# Patient Record
Sex: Male | Born: 1950 | Race: Asian | Hispanic: No | Marital: Married
Health system: Southern US, Community
[De-identification: ages and names within clinical notes are randomized; demographics above are authoritative.]

## PROBLEM LIST (undated history)

## (undated) DIAGNOSIS — I255 Ischemic cardiomyopathy: Secondary | ICD-10-CM

## (undated) DIAGNOSIS — I2510 Atherosclerotic heart disease of native coronary artery without angina pectoris: Secondary | ICD-10-CM

## (undated) DIAGNOSIS — I10 Essential (primary) hypertension: Secondary | ICD-10-CM

## (undated) DIAGNOSIS — I219 Acute myocardial infarction, unspecified: Secondary | ICD-10-CM

## (undated) DIAGNOSIS — Z9862 Peripheral vascular angioplasty status: Secondary | ICD-10-CM

## (undated) DIAGNOSIS — E7800 Pure hypercholesterolemia, unspecified: Secondary | ICD-10-CM

## (undated) HISTORY — PX: PROSTATECTOMY: SHX69

## (undated) HISTORY — PX: COLON SURGERY: SHX602

## (undated) HISTORY — PX: ANGIOPLASTY: SHX39

---

## 2013-02-25 ENCOUNTER — Inpatient Hospital Stay: Payer: Self-pay | Admitting: Internal Medicine

## 2013-02-25 LAB — CBC
HGB: 14 g/dL (ref 13.0–18.0)
MCH: 22.7 pg — ABNORMAL LOW (ref 26.0–34.0)
MCV: 72 fL — ABNORMAL LOW (ref 80–100)
RBC: 6.15 10*6/uL — ABNORMAL HIGH (ref 4.40–5.90)
RDW: 15.9 % — ABNORMAL HIGH (ref 11.5–14.5)

## 2013-02-25 LAB — COMPREHENSIVE METABOLIC PANEL
Albumin: 4 g/dL (ref 3.4–5.0)
Alkaline Phosphatase: 80 U/L (ref 50–136)
Anion Gap: 6 — ABNORMAL LOW (ref 7–16)
BUN: 17 mg/dL (ref 7–18)
Bilirubin,Total: 0.3 mg/dL (ref 0.2–1.0)
Glucose: 126 mg/dL — ABNORMAL HIGH (ref 65–99)
Osmolality: 282 (ref 275–301)
Potassium: 4 mmol/L (ref 3.5–5.1)
SGOT(AST): 19 U/L (ref 15–37)
SGPT (ALT): 20 U/L (ref 12–78)
Sodium: 140 mmol/L (ref 136–145)
Total Protein: 7.4 g/dL (ref 6.4–8.2)

## 2013-02-25 LAB — HEMOGLOBIN A1C: Hemoglobin A1C: 6.1 % (ref 4.2–6.3)

## 2013-02-25 LAB — PROTIME-INR: Prothrombin Time: 12.9 secs (ref 11.5–14.7)

## 2013-02-25 LAB — HEMATOCRIT: HCT: 41.9 % (ref 40.0–52.0)

## 2013-02-26 LAB — CBC WITH DIFFERENTIAL/PLATELET
Basophil #: 0 10*3/uL (ref 0.0–0.1)
Eosinophil #: 0.1 10*3/uL (ref 0.0–0.7)
Eosinophil %: 1.9 %
HCT: 38.6 % — ABNORMAL LOW (ref 40.0–52.0)
HGB: 12.4 g/dL — ABNORMAL LOW (ref 13.0–18.0)
Lymphocyte #: 1.5 10*3/uL (ref 1.0–3.6)
Lymphocyte %: 22 %
MCH: 22.8 pg — ABNORMAL LOW (ref 26.0–34.0)
MCHC: 32.1 g/dL (ref 32.0–36.0)
MCV: 71 fL — ABNORMAL LOW (ref 80–100)
Monocyte %: 8.4 %
Neutrophil #: 4.6 10*3/uL (ref 1.4–6.5)
Platelet: 156 10*3/uL (ref 150–440)
WBC: 6.8 10*3/uL (ref 3.8–10.6)

## 2013-02-26 LAB — BASIC METABOLIC PANEL
BUN: 10 mg/dL (ref 7–18)
Calcium, Total: 8.1 mg/dL — ABNORMAL LOW (ref 8.5–10.1)
Creatinine: 1.12 mg/dL (ref 0.60–1.30)
EGFR (African American): >60
Glucose: 90 mg/dL (ref 65–99)
Sodium: 141 mmol/L (ref 136–145)

## 2013-04-07 ENCOUNTER — Ambulatory Visit: Payer: Self-pay | Admitting: Gastroenterology

## 2013-04-10 LAB — PATHOLOGY REPORT

## 2014-09-29 ENCOUNTER — Emergency Department: Payer: Self-pay | Admitting: Emergency Medicine

## 2014-09-29 LAB — URINALYSIS, COMPLETE
Bacteria: NONE SEEN
Bilirubin,UR: NEGATIVE
GLUCOSE, UR: NEGATIVE mg/dL (ref 0–75)
LEUKOCYTE ESTERASE: NEGATIVE
Nitrite: NEGATIVE
PH: 6 (ref 4.5–8.0)
PROTEIN: NEGATIVE
RBC,UR: 1 /HPF (ref 0–5)
SPECIFIC GRAVITY: 1.01 (ref 1.003–1.030)
Squamous Epithelial: NONE SEEN

## 2014-09-29 LAB — COMPREHENSIVE METABOLIC PANEL
ALBUMIN: 4.6 g/dL
ALT: 15 U/L — AB
AST: 21 U/L
Alkaline Phosphatase: 52 U/L
Anion Gap: 8 (ref 7–16)
BUN: 12 mg/dL
Bilirubin,Total: 1.1 mg/dL
Calcium, Total: 9.1 mg/dL
Chloride: 106 mmol/L
Co2: 26 mmol/L
Creatinine: 1.34 mg/dL — ABNORMAL HIGH
EGFR (African American): >60
EGFR (Non-African Amer.): 56 — ABNORMAL LOW
GLUCOSE: 105 mg/dL — AB
Potassium: 4 mmol/L
Sodium: 140 mmol/L
TOTAL PROTEIN: 8.1 g/dL

## 2014-09-29 LAB — CBC WITH DIFFERENTIAL/PLATELET
Basophil #: 0 10*3/uL (ref 0.0–0.1)
Basophil %: 0.1 %
EOS ABS: 0.1 10*3/uL (ref 0.0–0.7)
Eosinophil %: 0.5 %
HCT: 47.4 % (ref 40.0–52.0)
HGB: 14.7 g/dL (ref 13.0–18.0)
LYMPHS PCT: 14 %
Lymphocyte #: 1.4 10*3/uL (ref 1.0–3.6)
MCH: 22.2 pg — ABNORMAL LOW (ref 26.0–34.0)
MCHC: 31.1 g/dL — ABNORMAL LOW (ref 32.0–36.0)
MCV: 72 fL — ABNORMAL LOW (ref 80–100)
MONOS PCT: 4.4 %
Monocyte #: 0.5 x10 3/mm (ref 0.2–1.0)
NEUTROS ABS: 8.3 10*3/uL — AB (ref 1.4–6.5)
NEUTROS PCT: 81 %
PLATELETS: 164 10*3/uL (ref 150–440)
RBC: 6.62 10*6/uL — AB (ref 4.40–5.90)
RDW: 15.9 % — AB (ref 11.5–14.5)
WBC: 10.3 10*3/uL (ref 3.8–10.6)

## 2014-09-29 LAB — MAGNESIUM: Magnesium: 2.1 mg/dL

## 2014-09-29 LAB — LACTIC ACID, PLASMA
LACTIC ACID, VENOUS: 2.2 mmol/L — AB
LACTIC ACID, VENOUS: 2 mmol/L

## 2014-09-29 LAB — PROTIME-INR
INR: 1.1
PROTHROMBIN TIME: 14.1 s

## 2014-09-29 LAB — TROPONIN I

## 2014-09-29 LAB — PHOSPHORUS: Phosphorus: 2.4 mg/dL — ABNORMAL LOW

## 2014-09-29 LAB — RAPID INFLUENZA A&B ANTIGENS

## 2014-09-30 LAB — URINE CULTURE

## 2014-10-04 LAB — CULTURE, BLOOD (SINGLE)

## 2014-10-26 NOTE — Consult Note (Signed)
Chief Complaint:  Subjective/Chief Complaint seen for rectal bleeding.  NO recurrance since yesterday.  denies n/v or abdominal pain.   VITAL SIGNS/ANCILLARY NOTES: **Vital Signs.:   24-Aug-14 13:29  Vital Signs Type Routine  Temperature Temperature (F) 97.4  Celsius 36.3  Temperature Source oral  Pulse Pulse 74  Respirations Respirations 20  Systolic BP Systolic BP 921  Diastolic BP (mmHg) Diastolic BP (mmHg) 80  Mean BP 104  Pulse Ox % Pulse Ox % 99  Pulse Ox Activity Level  At rest  Oxygen Delivery Room Air/ 21 %   Brief Assessment:  Cardiac Regular   Respiratory clear BS   Gastrointestinal details normal Soft  Nontender  Nondistended  No masses palpable   Lab Results: Routine Chem:  23-Aug-14 03:48   BUN 17  24-Aug-14 05:37   Glucose, Serum 90  BUN 10  Sodium, Serum 141  Potassium, Serum 3.9  Chloride, Serum  112  CO2, Serum 24  Calcium (Total), Serum  8.1  Anion Gap  5  Osmolality (calc) 280  eGFR (African American) >60  eGFR (Non-African American) >60 (eGFR values <37m/min/1.73 m2 may be an indication of chronic kidney disease (CKD). Calculated eGFR is useful in patients with stable renal function. The eGFR calculation will not be reliable in acutely ill patients when serum creatinine is changing rapidly. It is not useful in  patients on dialysis. The eGFR calculation may not be applicable to patients at the low and high extremes of body sizes, pregnant women, and vegetarians.)  Routine Coag:  23-Aug-14 03:48   INR 1.0 (INR reference interval applies to patients on anticoagulant therapy. A single INR therapeutic range for coumarins is not optimal for all indications; however, the suggested range for most indications is 2.0 - 3.0. Exceptions to the INR Reference Range may include: Prosthetic heart valves, acute myocardial infarction, prevention of myocardial infarction, and combinations of aspirin and anticoagulant. The need for a higher or lower  target INR must be assessed individually. Reference: The Pharmacology and Management of the Vitamin K  antagonists: the seventh ACCP Conference on Antithrombotic and Thrombolytic Therapy. CJHERD.4081Sept:126 (3suppl): [id number] A HCT value >55% may artifactually increase the PT.  In one study,  the increase was an average of 25%. Reference:  "Effect on Routine and Special Coagulation Testing Values of Citrate Anticoagulant Adjustment in Patients with High HCT Values." American Journal of Clinical Pathology 2006;126:400-405.)  Routine Hem:  23-Aug-14 03:48   Hematocrit (CBC) 44.1    10:21   Hematocrit (CBC) 42.9 (Result(s) reported on 25 Feb 2013 at 10:57AM.)    17:05   Hematocrit (CBC) 41.9 (Result(s) reported on 25 Feb 2013 at 05:30PM.)  24-Aug-14 05:37   WBC (CBC) 6.8  RBC (CBC) 5.44  Hemoglobin (CBC)  12.4  Hematocrit (CBC)  38.6  Platelet Count (CBC) 156  MCV  71  MCH  22.8  MCHC 32.1  RDW  15.0  Neutrophil % 67.4  Lymphocyte % 22.0  Monocyte % 8.4  Eosinophil % 1.9  Basophil % 0.3  Neutrophil # 4.6  Lymphocyte # 1.5  Monocyte # 0.6  Eosinophil # 0.1  Basophil # 0.0 (Result(s) reported on 26 Feb 2013 at 06:10AM.)   Assessment/Plan:  Assessment/Plan:  Assessment 1) rectal bleeding-likely internal hemorrhoids/anal outlet.  will start treatment with analpram hc 2.5 5 cream tid for 10 days.  Recommend o/p GI fu in 7-10 days.  will arrange for colonoscopy as o/p.  3) patietn states he has no insurance, I discussed this  with Dr Dustin Flock, recommend social service consult tomorrow am.   Plan as above/   Electronic Signatures: Loistine Simas (MD)  (Signed 24-Aug-14 16:00)  Authored: Chief Complaint, VITAL SIGNS/ANCILLARY NOTES, Brief Assessment, Lab Results, Assessment/Plan   Last Updated: 24-Aug-14 16:00 by Loistine Simas (MD)

## 2014-10-26 NOTE — Consult Note (Signed)
PATIENT NAME:  Michael Hess, Michael Hess MR#:  [id number] DATE OF BIRTH:  Oct 08, 1950  DATE OF CONSULTATION:  02/25/2013  CONSULTING PHYSICIAN:  Christena DeemMartin U. Skulskie, MD  This is a patient of Dr. Auburn BilberryShreyang Cobbs.  REASON FOR CONSULTATION: Rectal bleeding.   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 [place] has been in [place] a [place]. He has fairly good communication skills, however, at times requires his son to interpret a particular word or so. The patient states he woke up early this morning about midnight with a sharp umbilical pain. He then had an episode of bright red rectal bleeding. This happened 2 more times over the next hour and a half or so and he came to the hospital for further evaluation. His last episode of rectal bleeding was about an hour and a half before I saw him in the room. He does take an aspirin once a day, this being a mini dose. He has had no previous episodes of this. There has been no nausea or vomiting. Generally a bowel movement every morning. No black stools, blood in the stools or slimy stools. He denies any heartburn or dysphagia. He has had a good appetite. There has been no weight loss. He has never had a problem with peptic ulcer disease. He has never had a colonoscopy.   GASTROINTESTINAL FAMILY HISTORY: Negative for colorectal cancer, liver disease or ulcers.   PAST MEDICAL HISTORY: He has had angioplasty x 2 with 2 coronary stents. He had a catheterization apparently about a year ago that showed no evidence of obstructive coronary artery disease. He has a history of hyperlipidemia, history of BPH with a prostatectomy transurethral as well as hypertension. He was formerly on Plavix in regards to his stents, however, experienced some bleeding of his tongue while on that agent. It was subsequently discontinued by his physicians.   ALLERGIES: HE IS ALLERGIC TO PENICILLIN.   OUTPATIENT MEDICATIONS: Include 81 mg aspirin,  Crestor 5 mg a day, losartan 25 mg 1 twice a day, propranolol 20 mg once a day.   REVIEW OF SYSTEMS: Ten systems reviewed per admission history and physical, agree with same.   SOCIAL HISTORY: He does not smoke. He will occasionally drink small amounts of alcohol. He does not use any drugs.   PHYSICAL EXAMINATION: VITAL SIGNS: Temperature is 98.1, pulse 57, respirations 20, blood pressure 182/97, pulse oximetry 100%.  GENERAL: He is a 64 year old [place] male in no acute distress.  HEENT: Normocephalic, atraumatic. Eyes are anicteric. Nose: Septum midline. No lesions. Oropharynx: No lesions.  NECK: Supple. No JVD. No lymphadenopathy. No thyromegaly.  HEART: Regular rate and rhythm.  LUNGS: Clear.  ABDOMEN: Soft. There is some minimal discomfort to palpation at the umbilicus. Bowel sounds are positive, normoactive. There is no apparent organomegaly or masses felt. There is no rebound.  RECTAL: Anorectal examination shows him to have some local swelling making it difficult to introduce the examining finger and a very watery, relatively fresh, pale bloody effluent on the examining glove.   LABORATORY AND RADIOLOGICAL DATA: Include the following: On admission to the hospital, he had a normal metabolic panel with the exception of a glucose at 126, normal hepatic panel. His hemogram on admission showed a white count of 9.6, hemoglobin and hematocrit 14.0 and 44.1, platelet count of 190; MCV was 72. He has had 2 hematocrits since his initial on admission, the first being 42.9 and the second being 41.9. His INR is  1.0. He has had no imaging studies.   ASSESSMENT: Rectal bleeding. The patient presents with rectal bleeding consistent with, likely anal outlet bleeding. There has been a change of about perhaps less than 1 unit of blood. He has also been hydrated. It is of note that he has a microcytic profile, but was not anemic. Question would be whether there was any evidence of thalassemia as well. His  hemogram has been stable. He has been stable hemodynamically.   RECOMMENDATIONS: 1.  Continue serial hemoglobin, transfuse as needed.  2.  Would consider a tagged bleeding scan for recurrent significant bleeding.  3.  Will start him on some treatment for rectal bleeding tomorrow depending on how he does overnight, this being hemorrhoidal type cream.  4.  The patient does need to have a colonoscopy. He is presenting with microcytic indices, although he is not anemic. Would further recommend a hemoglobin electrophoresis in this regard. We will follow with you.   ____________________________ Christena Deem, MD mus:jm D: 02/25/2013 18:31:55 ET T: 02/25/2013 19:07:24 ET JOB#: [id number]  cc: Christena Deem, MD, <Dictator> Christena Deem MD ELECTRONICALLY SIGNED 03/16/2013 20:22

## 2014-10-26 NOTE — Consult Note (Signed)
Brief Consult Note: Diagnosis: hematochezia.   Patient was seen by consultant.   Consult note dictated.   Recommend further assessment or treatment.   Orders entered.   Comments: Patient seen and examined, please see full GI consult.  Paitent presenting with acute onset of rectal bleeding with some periumbilical discomfort.  Hemodynamically stable, normal hgb/hct with drop of less than one unit despite hydration.  Less abdominal discomfort currently.  Most likely anal outlet bleeding/ possible hemorrhoidal.  If there is further significant bleedign recommend bleeding scan.  Will start treatment for hemorrhoidal/anal outlet bleeding.  Patietn has never had a colonoscopy, and depending on clinical development, will most likely be done as outpatient.  Recommend hgb electrophoresis due to microcytosis in the setting of no anemia.  Following.  Electronic Signatures: Barnetta ChapelSkulskie, Alexandrina Fiorini (MD)  (Signed 23-Aug-14 18:36)  Authored: Brief Consult Note   Last Updated: 23-Aug-14 18:36 by Barnetta ChapelSkulskie, Teagan Ozawa (MD)

## 2014-10-26 NOTE — H&P (Signed)
PATIENT NAME:  Michael Hess, Michael Hess MR#:  [id number] DATE OF BIRTH:  10-23-1950  DATE OF ADMISSION:  02/25/2013  PRIMARY CARE PROVIDER: None.   EMERGENCY DEPARTMENT REFERRING PHYSICIAN: Rebecka ApleyAllison P. Webster, MD   CHIEF COMPLAINT: Bright red blood x6 episodes.   HISTORY OF PRESENT ILLNESS: The patient is a 64 year old [place] male who [place] living in [place] for the past 11 months. 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 He states that he was in his normal state of health, when all of a sudden around 3:00 a.m., he developed abdominal cramping followed by bright red blood. The patient reports that he has had a total of 6 bowel movements with bright red blood in large amount. He reports that he has not had any history of having any GI bleeding in the past. He has not had any abdominal pains. He does not take any NSAIDs except aspirin once a day. The patient reports that he does have a history of having bleeding from his tongue which started about 4 years ago. At that time, he was on Plavix, so he held that, and the bleeding stopped, and then about a year ago, he restarted the Plavix and started having some bleeding from his tongue, so he stopped the Plavix about a month ago and has not had any further bleeding from his tongue. He denies any weight loss, nausea or vomiting. No hematemesis or any chest pain or shortness of breath.   PAST MEDICAL HISTORY:  1. History of coronary artery disease with angioplasty done 4 years ago and then had a catheterization about a year ago which showed no evidence of obstructive coronary artery disease.  2. History of hyperlipidemia.  3. Hypertension.  4. History of BPH status post prostatectomy.   PAST SURGICAL HISTORY: Prostate surgery.   ALLERGIES: PENICILLIN, CAUSING NAUSEA, VOMITING.   CURRENT MEDICATIONS:  1. Aspirin, he states that he takes 75 mg, which is the dose prescribed in [place]. 2. Losartan 25 b.i.d. 3. Propranolol 25 p.o. daily.  4. Multivitamin  daily.  5. Rosuvastatin 5 daily.   SOCIAL HISTORY: Does not smoke. Drinks socially occasionally. No drug use.   FAMILY HISTORY: No history of colon cancer or any other malignancies in the family.   REVIEW OF SYSTEMS:  CONSTITUTIONAL: Denies any fevers. No fatigue. No weakness. No pain. No weight loss. No weight gain.  EYES: No blurred or double vision. No pain. No redness. No inflammation. No glaucoma or cataracts.  ENT: No tinnitus. No ear pain. No hearing loss. No seasonal or year-round allergies. No epistaxis. No nasal discharge. No snoring. No difficulty swallowing.  RESPIRATORY: Denies any cough, wheezing, hemoptysis. No COPD. No TB.  CARDIOVASCULAR: Denies any chest pain, orthopnea, edema or arrhythmia. No dyspnea on exertion. No palpitations.  GASTROINTESTINAL: No nausea, vomiting, diarrhea. Had abdominal cramping, but no further symptoms. No hematemesis. No ulcers. No GERD. No IBS. No jaundice. No changes in bowel habits.  GENITOURINARY: Denies any dysuria, hematuria, renal calculus or frequency.  ENDOCRINE: Denies any polyuria or nocturia or thyroid problems.  HEMATOLOGIC AND LYMPHATIC: Denies anemia. History of easy bleeding with taking Plavix, but not anymore.  SKIN: No acne. No rash. No changes in mole, hair or skin.  MUSCULOSKELETAL: No pain in the neck, back or shoulder.  NEUROLOGIC: No numbness. No CVA. No TIA. No seizures.  PSYCHIATRIC: No anxiety. No insomnia. No ADD. No OCD.   PHYSICAL EXAMINATION:  VITAL SIGNS: Temperature 98, pulse 56, respirations 18, blood pressure  was 184/101 on presentation.  GENERAL: The patient is a well-developed, well-nourished male in no acute distress.  HEENT: Head atraumatic, normocephalic. Pupils equally round and reactive to light and accommodation. There is no conjunctival pallor. No scleral icterus. Nasal exam shows no drainage or ulceration. Oropharynx is clear, without any exudates. External ear exam shows no drainage or lesions.  NECK:  Supple without any JVD. No carotid bruits.  CARDIOVASCULAR: Regular rate and rhythm. No murmurs, rubs, clicks or gallops. PMI is not displaced.  LUNGS: Clear to auscultation bilaterally without any rales, rhonchi or wheezing.  ABDOMEN: Soft, nontender, nondistended. Positive bowel sounds x4. No hepatosplenomegaly.  EXTREMITIES: No clubbing, cyanosis or edema.  SKIN: No rash.  LYMPHATICS: No lymph nodes palpable.  VASCULAR: Good DP, PT pulses.  PSYCHIATRIC: Not anxious or depressed.  NEUROLOGIC: Awake, alert, oriented x3. No focal deficits.  PSYCHIATRIC: Not anxious or depressed.  RECTAL: Hemorrhoid noted on rectal exam externally, but no evidence of inflammation or bleeding from the hemorrhoid.   LABORATORY AND EVALUATIONS: Glucose 126, BUN 17, creatinine 1.23, sodium 140, potassium 4.0, chloride 107, CO2 is 27, calcium 8.6. LFTs are normal. WBC 9.6, hemoglobin 14, platelet count 190 with MCV 72.   ASSESSMENT AND PLAN: The patient is a 64 year old [place] male with 6 large bright red bowel movements since early this morning.   1. Acute gastrointestinal bleed, likely lower. At this time, his hemoglobin is stable. Will follow his hematocrit. Transfuse as needed. Give him IV fluids. I will ask GI to see the patient. I will also place him on Protonix IV b.i.d. for the time being.  2. Hypertension, which is accelerated on presentation. Will continue his losartan and propranolol. Will add p.r.n. hydralazine for elevated blood pressure greater than 170.  3. Hyperlipidemia. Will continue his rosuvastatin as taking at home.  4. History of coronary artery disease. Will hold aspirin for now.  5. Miscellaneous. Will use TED hose for deep vein thrombosis prophylaxis.   TIME SPENT: Note, 45 minutes spent.   ____________________________ Lacie Scotts. Allena Katz, MD shp:OSi D: 02/25/2013 09:03:03 ET T: 02/25/2013 09:17:38 ET JOB#: [id number]  cc: Alle Difabio H. Allena Katz, MD, <Dictator> Charise Carwin  MD ELECTRONICALLY SIGNED 02/28/2013 18:24

## 2014-10-26 NOTE — Discharge Summary (Signed)
PATIENT NAME:  Michael PhenixEL, Ordean MR#:  [id number] DATE OF BIRTH:  07-18-1950  DATE OF ADMISSION:  02/25/2013 DATE OF DISCHARGE:  02/27/2013  ADMITTING DIAGNOSIS: Bright red blood per rectum.   DISCHARGE DIAGNOSES:  1. Bright red blood per rectum, possibly due to diverticular bleed, also could be related to internal hemorrhoids as well as anal fissure. The patient's hemoglobin did drop, but did not require transfusion. Status post GI evaluation.  2. History of coronary artery disease with angioplasty done 4 years prior, with cardiac catheterization repeated about a year ago which showed no evidence of obstructive coronary artery disease.  3. Hyperlipidemia.  4. Hypertension.  5. History of benign prostatic hypertrophy, status post prostatectomy.   CONSULTANTS: Dr. Marva PandaSkulskie.   PERTINENT LABORATORY AND EVALUATIONS: Admitting glucose 126, BUN 17, creatinine 1.23, sodium 140, potassium 4.0, chloride 107, CO2 is 27, calcium 8.6. LFTs were normal. WBC 8.6, hemoglobin 14, platelet count was 190, MCV of 72.   HOSPITAL COURSE: Please refer to H and P done by me on admission. The patient is a 64 year old [place] male who [place] the past 11 months, who was on aspirin and Plavix, but he stopped the Plavix about a month ago because he was having some bleeding from his mouth, which he had previously as well. He stopped the Plavix about a month ago, but he was still taking aspirin. He presented to the ED on day of admission with bright red blood, 6 episodes. The patient's aspirin was held, and he was monitored in the hospital. He had a GI consult. His hemoglobin was followed, given IV fluids. After being admitted initially, he had no further evidence of GI bleeding. The patient was felt to have possible bleeding as a result of internal hemorrhoids or a rectal lesion or could have been a diverticular bleed. The patient was recommended to follow up with outpatient GI for a colonoscopy.  At this time, he is doing much better, no further bleeding and is stable for discharge.   DISCHARGE MEDICATIONS:  1. Losartan 25 mg 1 tab p.o. b.i.d. 2. Propranolol 20 mg 1 tab p.o. daily.  3. Crestor 5 at bedtime.  4. Hydrocortisone 1 application rectally 3 times a day.  5. Colace 100 mg  1 tab p.o. at bedtime.  6. MiraLax 17 grams daily.   DIET: Low sodium, low fat, low cholesterol.   ACTIVITY: As tolerated.   FOLLOWUP: As outpatient for colonoscopy with Dr. Marva PandaSkulskie. The patient told not to take aspirin for the next 7 days.   TIME SPENT: Note, 32 minutes spent on the discharge.    ____________________________ Lacie ScottsShreyang H. Allena KatzPatel, MD shp:OSi D: 02/28/2013 11:42:06 ET T: 02/28/2013 12:52:43 ET JOB#: [id number]  cc: [place] H. Allena KatzPatel, MD, <Dictator> Charise CarwinSHREYANG H Mckissack MD ELECTRONICALLY SIGNED 02/28/2013 18:26

## 2015-05-22 ENCOUNTER — Other Ambulatory Visit: Payer: Self-pay | Admitting: Physician Assistant

## 2015-05-22 DIAGNOSIS — M5412 Radiculopathy, cervical region: Secondary | ICD-10-CM

## 2015-06-11 ENCOUNTER — Ambulatory Visit
Admission: RE | Admit: 2015-06-11 | Discharge: 2015-06-11 | Disposition: A | Discharge status: Home / Self Care | Payer: 59 | Source: Ambulatory Visit | Attending: Physician Assistant | Admitting: Physician Assistant

## 2015-06-11 DIAGNOSIS — M5412 Radiculopathy, cervical region: Secondary | ICD-10-CM | POA: Diagnosis present

## 2015-06-11 DIAGNOSIS — R202 Paresthesia of skin: Secondary | ICD-10-CM | POA: Insufficient documentation

## 2015-06-11 DIAGNOSIS — M4802 Spinal stenosis, cervical region: Secondary | ICD-10-CM | POA: Diagnosis not present

## 2018-06-29 ENCOUNTER — Other Ambulatory Visit: Payer: Self-pay

## 2018-06-29 ENCOUNTER — Inpatient Hospital Stay (HOSPITAL_COMMUNITY)
Admission: EM | Admit: 2018-06-29 | Discharge: 2018-07-06 | DRG: 235 | Disposition: E | Payer: Medicare Other | Source: Other Acute Inpatient Hospital | Attending: Thoracic Surgery (Cardiothoracic Vascular Surgery) | Admitting: Thoracic Surgery (Cardiothoracic Vascular Surgery)

## 2018-06-29 ENCOUNTER — Encounter
Admission: EM | Disposition: A | Discharge status: Other Acute Inpatient Hospital | Payer: Self-pay | Source: Home / Self Care | Attending: Emergency Medicine

## 2018-06-29 ENCOUNTER — Emergency Department
Admission: EM | Admit: 2018-06-29 | Discharge: 2018-06-29 | Disposition: A | Discharge status: Other Acute Inpatient Hospital | Payer: Medicare Other | Attending: Cardiovascular Disease | Admitting: Cardiovascular Disease

## 2018-06-29 ENCOUNTER — Encounter (HOSPITAL_COMMUNITY): Payer: Self-pay | Admitting: Cardiology

## 2018-06-29 ENCOUNTER — Emergency Department: Payer: Medicare Other

## 2018-06-29 DIAGNOSIS — I25119 Atherosclerotic heart disease of native coronary artery with unspecified angina pectoris: Secondary | ICD-10-CM | POA: Insufficient documentation

## 2018-06-29 DIAGNOSIS — I110 Hypertensive heart disease with heart failure: Secondary | ICD-10-CM | POA: Insufficient documentation

## 2018-06-29 DIAGNOSIS — E7800 Pure hypercholesterolemia, unspecified: Secondary | ICD-10-CM | POA: Insufficient documentation

## 2018-06-29 DIAGNOSIS — I5021 Acute systolic (congestive) heart failure: Secondary | ICD-10-CM | POA: Diagnosis present

## 2018-06-29 DIAGNOSIS — J95821 Acute postprocedural respiratory failure: Secondary | ICD-10-CM | POA: Diagnosis not present

## 2018-06-29 DIAGNOSIS — Z79899 Other long term (current) drug therapy: Secondary | ICD-10-CM

## 2018-06-29 DIAGNOSIS — I959 Hypotension, unspecified: Secondary | ICD-10-CM | POA: Diagnosis not present

## 2018-06-29 DIAGNOSIS — I4891 Unspecified atrial fibrillation: Secondary | ICD-10-CM | POA: Diagnosis not present

## 2018-06-29 DIAGNOSIS — E878 Other disorders of electrolyte and fluid balance, not elsewhere classified: Secondary | ICD-10-CM | POA: Diagnosis not present

## 2018-06-29 DIAGNOSIS — E875 Hyperkalemia: Secondary | ICD-10-CM | POA: Diagnosis not present

## 2018-06-29 DIAGNOSIS — K567 Ileus, unspecified: Secondary | ICD-10-CM | POA: Diagnosis not present

## 2018-06-29 DIAGNOSIS — I2129 ST elevation (STEMI) myocardial infarction involving other sites: Secondary | ICD-10-CM | POA: Diagnosis not present

## 2018-06-29 DIAGNOSIS — Z951 Presence of aortocoronary bypass graft: Secondary | ICD-10-CM | POA: Diagnosis not present

## 2018-06-29 DIAGNOSIS — Z978 Presence of other specified devices: Secondary | ICD-10-CM

## 2018-06-29 DIAGNOSIS — Z955 Presence of coronary angioplasty implant and graft: Secondary | ICD-10-CM | POA: Diagnosis not present

## 2018-06-29 DIAGNOSIS — N183 Chronic kidney disease, stage 3 (moderate): Secondary | ICD-10-CM | POA: Diagnosis present

## 2018-06-29 DIAGNOSIS — Z7982 Long term (current) use of aspirin: Secondary | ICD-10-CM

## 2018-06-29 DIAGNOSIS — Z8249 Family history of ischemic heart disease and other diseases of the circulatory system: Secondary | ICD-10-CM | POA: Diagnosis not present

## 2018-06-29 DIAGNOSIS — Z452 Encounter for adjustment and management of vascular access device: Secondary | ICD-10-CM

## 2018-06-29 DIAGNOSIS — D62 Acute posthemorrhagic anemia: Secondary | ICD-10-CM | POA: Diagnosis not present

## 2018-06-29 DIAGNOSIS — R680 Hypothermia, not associated with low environmental temperature: Secondary | ICD-10-CM | POA: Diagnosis not present

## 2018-06-29 DIAGNOSIS — N170 Acute kidney failure with tubular necrosis: Secondary | ICD-10-CM | POA: Diagnosis not present

## 2018-06-29 DIAGNOSIS — E785 Hyperlipidemia, unspecified: Secondary | ICD-10-CM | POA: Diagnosis present

## 2018-06-29 DIAGNOSIS — I440 Atrioventricular block, first degree: Secondary | ICD-10-CM | POA: Diagnosis not present

## 2018-06-29 DIAGNOSIS — J9811 Atelectasis: Secondary | ICD-10-CM | POA: Diagnosis present

## 2018-06-29 DIAGNOSIS — R579 Shock, unspecified: Secondary | ICD-10-CM | POA: Diagnosis not present

## 2018-06-29 DIAGNOSIS — R000 Tachycardia, unspecified: Secondary | ICD-10-CM | POA: Diagnosis not present

## 2018-06-29 DIAGNOSIS — D631 Anemia in chronic kidney disease: Secondary | ICD-10-CM | POA: Diagnosis present

## 2018-06-29 DIAGNOSIS — I25110 Atherosclerotic heart disease of native coronary artery with unstable angina pectoris: Secondary | ICD-10-CM

## 2018-06-29 DIAGNOSIS — I252 Old myocardial infarction: Secondary | ICD-10-CM | POA: Diagnosis not present

## 2018-06-29 DIAGNOSIS — R0789 Other chest pain: Secondary | ICD-10-CM | POA: Diagnosis present

## 2018-06-29 DIAGNOSIS — I130 Hypertensive heart and chronic kidney disease with heart failure and stage 1 through stage 4 chronic kidney disease, or unspecified chronic kidney disease: Secondary | ICD-10-CM | POA: Diagnosis present

## 2018-06-29 DIAGNOSIS — Z91018 Allergy to other foods: Secondary | ICD-10-CM | POA: Diagnosis not present

## 2018-06-29 DIAGNOSIS — Z880 Allergy status to penicillin: Secondary | ICD-10-CM | POA: Insufficient documentation

## 2018-06-29 DIAGNOSIS — I4510 Unspecified right bundle-branch block: Secondary | ICD-10-CM | POA: Diagnosis not present

## 2018-06-29 DIAGNOSIS — I2510 Atherosclerotic heart disease of native coronary artery without angina pectoris: Secondary | ICD-10-CM | POA: Diagnosis not present

## 2018-06-29 DIAGNOSIS — K56609 Unspecified intestinal obstruction, unspecified as to partial versus complete obstruction: Secondary | ICD-10-CM | POA: Diagnosis not present

## 2018-06-29 DIAGNOSIS — Z4659 Encounter for fitting and adjustment of other gastrointestinal appliance and device: Secondary | ICD-10-CM

## 2018-06-29 DIAGNOSIS — I213 ST elevation (STEMI) myocardial infarction of unspecified site: Secondary | ICD-10-CM | POA: Diagnosis present

## 2018-06-29 DIAGNOSIS — N19 Unspecified kidney failure: Secondary | ICD-10-CM

## 2018-06-29 DIAGNOSIS — T82528A Displacement of other cardiac and vascular devices and implants, initial encounter: Secondary | ICD-10-CM

## 2018-06-29 DIAGNOSIS — I255 Ischemic cardiomyopathy: Secondary | ICD-10-CM | POA: Diagnosis present

## 2018-06-29 DIAGNOSIS — I700 Atherosclerosis of aorta: Secondary | ICD-10-CM | POA: Diagnosis present

## 2018-06-29 DIAGNOSIS — J9601 Acute respiratory failure with hypoxia: Secondary | ICD-10-CM | POA: Diagnosis not present

## 2018-06-29 DIAGNOSIS — R011 Cardiac murmur, unspecified: Secondary | ICD-10-CM | POA: Diagnosis present

## 2018-06-29 DIAGNOSIS — E872 Acidosis: Secondary | ICD-10-CM | POA: Diagnosis not present

## 2018-06-29 DIAGNOSIS — R0602 Shortness of breath: Secondary | ICD-10-CM | POA: Diagnosis not present

## 2018-06-29 DIAGNOSIS — R570 Cardiogenic shock: Secondary | ICD-10-CM | POA: Diagnosis not present

## 2018-06-29 DIAGNOSIS — J9690 Respiratory failure, unspecified, unspecified whether with hypoxia or hypercapnia: Secondary | ICD-10-CM

## 2018-06-29 DIAGNOSIS — R34 Anuria and oliguria: Secondary | ICD-10-CM | POA: Diagnosis not present

## 2018-06-29 HISTORY — DX: Atherosclerotic heart disease of native coronary artery without angina pectoris: I25.10

## 2018-06-29 HISTORY — DX: Peripheral vascular angioplasty status: Z98.62

## 2018-06-29 HISTORY — DX: Ischemic cardiomyopathy: I25.5

## 2018-06-29 HISTORY — DX: Pure hypercholesterolemia, unspecified: E78.00

## 2018-06-29 HISTORY — PX: LEFT HEART CATH AND CORONARY ANGIOGRAPHY: CATH118249

## 2018-06-29 HISTORY — PX: CORONARY/GRAFT ACUTE MI REVASCULARIZATION: CATH118305

## 2018-06-29 HISTORY — DX: Essential (primary) hypertension: I10

## 2018-06-29 HISTORY — DX: Acute myocardial infarction, unspecified: I21.9

## 2018-06-29 LAB — TROPONIN I
Troponin I: 0.11 ng/mL (ref <0.03)
Troponin I: 3.98 ng/mL (ref <0.03)

## 2018-06-29 LAB — BRAIN NATRIURETIC PEPTIDE: B Natriuretic Peptide: 399 pg/mL — ABNORMAL HIGH (ref 0.0–100.0)

## 2018-06-29 LAB — HEPATIC FUNCTION PANEL
ALT: 16 U/L (ref 0–44)
AST: 54 U/L — AB (ref 15–41)
Albumin: 3.6 g/dL (ref 3.5–5.0)
Alkaline Phosphatase: 52 U/L (ref 38–126)
Bilirubin, Direct: 0.1 mg/dL (ref 0.0–0.2)
Indirect Bilirubin: 0.6 mg/dL (ref 0.3–0.9)
Total Bilirubin: 0.7 mg/dL (ref 0.3–1.2)
Total Protein: 7 g/dL (ref 6.5–8.1)

## 2018-06-29 LAB — BASIC METABOLIC PANEL
ANION GAP: 7 (ref 5–15)
BUN: 22 mg/dL (ref 8–23)
CO2: 26 mmol/L (ref 22–32)
Calcium: 9.2 mg/dL (ref 8.9–10.3)
Chloride: 106 mmol/L (ref 98–111)
Creatinine, Ser: 1.29 mg/dL — ABNORMAL HIGH (ref 0.61–1.24)
GFR calc Af Amer: >60 mL/min (ref >60)
GFR calc non Af Amer: 57 mL/min — ABNORMAL LOW (ref >60)
Glucose, Bld: 111 mg/dL — ABNORMAL HIGH (ref 70–99)
Potassium: 4.2 mmol/L (ref 3.5–5.1)
Sodium: 139 mmol/L (ref 135–145)

## 2018-06-29 LAB — CBC
HCT: 47.7 % (ref 39.0–52.0)
Hemoglobin: 14.7 g/dL (ref 13.0–17.0)
MCH: 22.2 pg — ABNORMAL LOW (ref 26.0–34.0)
MCHC: 30.8 g/dL (ref 30.0–36.0)
MCV: 72.2 fL — ABNORMAL LOW (ref 80.0–100.0)
Platelets: 197 10*3/uL (ref 150–400)
RBC: 6.61 MIL/uL — ABNORMAL HIGH (ref 4.22–5.81)
RDW: 17.4 % — ABNORMAL HIGH (ref 11.5–15.5)
WBC: 9.9 10*3/uL (ref 4.0–10.5)
nRBC: 0 % (ref 0.0–0.2)

## 2018-06-29 LAB — ABO/RH: ABO/RH(D): O POS

## 2018-06-29 LAB — POCT ACTIVATED CLOTTING TIME: Activated Clotting Time: 235 seconds

## 2018-06-29 LAB — MAGNESIUM: Magnesium: 2.2 mg/dL (ref 1.7–2.4)

## 2018-06-29 LAB — MRSA PCR SCREENING: MRSA by PCR: NEGATIVE

## 2018-06-29 LAB — PROTIME-INR
INR: 1.07
Prothrombin Time: 13.8 seconds (ref 11.4–15.2)

## 2018-06-29 LAB — T4, FREE: Free T4: 1.08 ng/dL (ref 0.82–1.77)

## 2018-06-29 SURGERY — CORONARY/GRAFT ACUTE MI REVASCULARIZATION
Anesthesia: Moderate Sedation

## 2018-06-29 MED ORDER — METOPROLOL TARTRATE 12.5 MG HALF TABLET
12.5000 mg | ORAL_TABLET | Freq: Once | ORAL | Status: AC
  Administered 2018-06-30: 12.5 mg via ORAL
  Filled 2018-06-29: qty 1

## 2018-06-29 MED ORDER — CHLORHEXIDINE GLUCONATE CLOTH 2 % EX PADS
6.0000 | MEDICATED_PAD | Freq: Once | CUTANEOUS | Status: AC
  Administered 2018-06-29: 6 via TOPICAL

## 2018-06-29 MED ORDER — EPINEPHRINE PF 1 MG/ML IJ SOLN
0.0000 ug/min | INTRAVENOUS | Status: AC
  Administered 2018-06-30: 5 ug/min via INTRAVENOUS
  Filled 2018-06-29: qty 4

## 2018-06-29 MED ORDER — NITROGLYCERIN 0.4 MG SL SUBL: 0.4000 mg | SUBLINGUAL_TABLET | SUBLINGUAL | Status: DC | PRN

## 2018-06-29 MED ORDER — HEPARIN (PORCINE) 25000 UT/250ML-% IV SOLN
600.0000 [IU]/h | INTRAVENOUS | Status: DC
  Administered 2018-06-30: 600 [IU]/h via INTRAVENOUS
  Filled 2018-06-29: qty 250

## 2018-06-29 MED ORDER — ASPIRIN EC 81 MG PO TBEC: 81.0000 mg | DELAYED_RELEASE_TABLET | Freq: Every day | ORAL | Status: DC

## 2018-06-29 MED ORDER — METOPROLOL TARTRATE 25 MG PO TABS
25.0000 mg | ORAL_TABLET | Freq: Four times a day (QID) | ORAL | Status: DC
  Administered 2018-06-29 – 2018-06-30 (×2): 25 mg via ORAL
  Filled 2018-06-29 (×2): qty 1

## 2018-06-29 MED ORDER — INSULIN REGULAR(HUMAN) IN NACL 100-0.9 UT/100ML-% IV SOLN
INTRAVENOUS | Status: DC
  Filled 2018-06-29: qty 100

## 2018-06-29 MED ORDER — HEPARIN SODIUM (PORCINE) 1000 UNIT/ML IJ SOLN
INTRAMUSCULAR | Status: DC | PRN
  Administered 2018-06-29: 4000 [IU] via INTRAVENOUS

## 2018-06-29 MED ORDER — ASPIRIN 81 MG PO CHEW
243.0000 mg | CHEWABLE_TABLET | Freq: Once | ORAL | Status: AC
  Administered 2018-06-29: 243 mg via ORAL

## 2018-06-29 MED ORDER — FENTANYL CITRATE (PF) 100 MCG/2ML IJ SOLN
INTRAMUSCULAR | Status: AC
  Filled 2018-06-29: qty 2

## 2018-06-29 MED ORDER — MAGNESIUM SULFATE 50 % IJ SOLN
40.0000 meq | INTRAMUSCULAR | Status: DC
  Filled 2018-06-29: qty 9.85

## 2018-06-29 MED ORDER — DOPAMINE-DEXTROSE 3.2-5 MG/ML-% IV SOLN
0.0000 ug/kg/min | INTRAVENOUS | Status: AC
  Administered 2018-06-30: 3 ug/kg/min via INTRAVENOUS
  Filled 2018-06-29: qty 250

## 2018-06-29 MED ORDER — VERAPAMIL HCL 2.5 MG/ML IV SOLN
INTRAVENOUS | Status: AC
  Filled 2018-06-29: qty 2

## 2018-06-29 MED ORDER — SODIUM CHLORIDE 0.9 % IV SOLN
INTRAVENOUS | Status: DC
  Administered 2018-06-29: 17:00:00 via INTRAVENOUS

## 2018-06-29 MED ORDER — LEVOFLOXACIN IN D5W 500 MG/100ML IV SOLN
500.0000 mg | INTRAVENOUS | Status: AC
  Administered 2018-06-30: 500 mg via INTRAVENOUS
  Filled 2018-06-29 (×2): qty 100

## 2018-06-29 MED ORDER — DIAZEPAM 5 MG PO TABS
5.0000 mg | ORAL_TABLET | Freq: Once | ORAL | Status: AC
  Administered 2018-06-30: 5 mg via ORAL
  Filled 2018-06-29: qty 1

## 2018-06-29 MED ORDER — ONDANSETRON HCL 4 MG/2ML IJ SOLN
4.0000 mg | Freq: Four times a day (QID) | INTRAMUSCULAR | Status: DC | PRN
  Filled 2018-06-29: qty 2

## 2018-06-29 MED ORDER — ACETAMINOPHEN 325 MG PO TABS: 650.0000 mg | ORAL_TABLET | ORAL | Status: DC | PRN

## 2018-06-29 MED ORDER — MILRINONE LACTATE IN DEXTROSE 20-5 MG/100ML-% IV SOLN
0.3000 ug/kg/min | INTRAVENOUS | Status: AC
  Administered 2018-06-30: .25 ug/kg/min via INTRAVENOUS
  Filled 2018-06-29: qty 100

## 2018-06-29 MED ORDER — BISACODYL 5 MG PO TBEC
5.0000 mg | DELAYED_RELEASE_TABLET | Freq: Once | ORAL | Status: AC
  Administered 2018-06-29: 5 mg via ORAL
  Filled 2018-06-29: qty 1

## 2018-06-29 MED ORDER — TRANEXAMIC ACID (OHS) BOLUS VIA INFUSION
15.0000 mg/kg | INTRAVENOUS | Status: AC
  Administered 2018-06-30: 931.5 mg via INTRAVENOUS
  Filled 2018-06-29: qty 932

## 2018-06-29 MED ORDER — PHENYLEPHRINE HCL-NACL 20-0.9 MG/250ML-% IV SOLN
30.0000 ug/min | INTRAVENOUS | Status: DC
  Filled 2018-06-29: qty 250

## 2018-06-29 MED ORDER — NITROGLYCERIN IN D5W 200-5 MCG/ML-% IV SOLN
2.0000 ug/min | INTRAVENOUS | Status: DC
  Filled 2018-06-29: qty 250

## 2018-06-29 MED ORDER — TRANEXAMIC ACID (OHS) PUMP PRIME SOLUTION
2.0000 mg/kg | INTRAVENOUS | Status: DC
  Filled 2018-06-29: qty 1.24

## 2018-06-29 MED ORDER — TICAGRELOR 90 MG PO TABS
ORAL_TABLET | ORAL | Status: AC
  Filled 2018-06-29: qty 2

## 2018-06-29 MED ORDER — NITROGLYCERIN IN D5W 200-5 MCG/ML-% IV SOLN
INTRAVENOUS | Status: AC
  Filled 2018-06-29: qty 250

## 2018-06-29 MED ORDER — PLASMA-LYTE 148 IV SOLN
INTRAVENOUS | Status: AC
  Administered 2018-06-30: 500 mL
  Filled 2018-06-29 (×2): qty 2.5

## 2018-06-29 MED ORDER — SODIUM CHLORIDE 0.9 % IV SOLN
INTRAVENOUS | Status: DC
  Filled 2018-06-29 (×2): qty 30

## 2018-06-29 MED ORDER — HEPARIN (PORCINE) IN NACL 1000-0.9 UT/500ML-% IV SOLN
INTRAVENOUS | Status: AC
  Filled 2018-06-29: qty 1000

## 2018-06-29 MED ORDER — HEPARIN SODIUM (PORCINE) 5000 UNIT/ML IJ SOLN
4000.0000 [IU] | Freq: Once | INTRAMUSCULAR | Status: AC
  Administered 2018-06-29: 4000 [IU] via INTRAVENOUS

## 2018-06-29 MED ORDER — ZOLPIDEM TARTRATE 5 MG PO TABS: 5.0000 mg | ORAL_TABLET | Freq: Every evening | ORAL | Status: DC | PRN

## 2018-06-29 MED ORDER — NITROGLYCERIN IN D5W 200-5 MCG/ML-% IV SOLN
0.0000 ug/min | INTRAVENOUS | Status: DC
  Administered 2018-06-29: 15 ug/min via INTRAVENOUS

## 2018-06-29 MED ORDER — HEPARIN SODIUM (PORCINE) 1000 UNIT/ML IJ SOLN
INTRAMUSCULAR | Status: AC
  Filled 2018-06-29: qty 1

## 2018-06-29 MED ORDER — HYDRALAZINE HCL 20 MG/ML IJ SOLN: 10.0000 mg | Freq: Four times a day (QID) | INTRAMUSCULAR | Status: DC | PRN

## 2018-06-29 MED ORDER — HEPARIN SODIUM (PORCINE) 5000 UNIT/ML IJ SOLN: 60.0000 [IU]/kg | Freq: Once | INTRAMUSCULAR | Status: DC

## 2018-06-29 MED ORDER — ORAL CARE MOUTH RINSE: 15.0000 mL | Freq: Two times a day (BID) | OROMUCOSAL | Status: DC

## 2018-06-29 MED ORDER — CHLORHEXIDINE GLUCONATE 0.12 % MT SOLN
15.0000 mL | Freq: Once | OROMUCOSAL | Status: AC
  Administered 2018-06-30: 15 mL via OROMUCOSAL

## 2018-06-29 MED ORDER — POTASSIUM CHLORIDE 2 MEQ/ML IV SOLN
80.0000 meq | INTRAVENOUS | Status: DC
  Filled 2018-06-29: qty 40

## 2018-06-29 MED ORDER — MIDAZOLAM HCL 2 MG/2ML IJ SOLN
INTRAMUSCULAR | Status: AC
  Filled 2018-06-29: qty 2

## 2018-06-29 MED ORDER — DEXMEDETOMIDINE HCL IN NACL 400 MCG/100ML IV SOLN
0.1000 ug/kg/h | INTRAVENOUS | Status: DC
  Filled 2018-06-29: qty 100

## 2018-06-29 MED ORDER — NITROGLYCERIN IN D5W 200-5 MCG/ML-% IV SOLN
INTRAVENOUS | Status: AC | PRN
  Administered 2018-06-29: 10 ug/min via INTRAVENOUS

## 2018-06-29 MED ORDER — ATORVASTATIN CALCIUM 80 MG PO TABS: 80.0000 mg | ORAL_TABLET | Freq: Every day | ORAL | Status: DC

## 2018-06-29 MED ORDER — SODIUM CHLORIDE 0.9 % IV SOLN
INTRAVENOUS | Status: DC
  Administered 2018-06-29: 21:00:00 via INTRAVENOUS

## 2018-06-29 MED ORDER — CHLORHEXIDINE GLUCONATE CLOTH 2 % EX PADS
6.0000 | MEDICATED_PAD | Freq: Once | CUTANEOUS | Status: AC
  Administered 2018-06-30: 6 via TOPICAL

## 2018-06-29 MED ORDER — TRANEXAMIC ACID 1000 MG/10ML IV SOLN
1.5000 mg/kg/h | INTRAVENOUS | Status: DC
  Filled 2018-06-29: qty 25

## 2018-06-29 MED ORDER — VANCOMYCIN HCL 10 G IV SOLR
1250.0000 mg | INTRAVENOUS | Status: AC
  Administered 2018-06-30: 1250 mg via INTRAVENOUS
  Filled 2018-06-29: qty 1250

## 2018-06-29 SURGICAL SUPPLY — 15 items
BALLN TREK RX 2.5X15 (BALLOONS)
BALLOON TREK RX 2.5X15 (BALLOONS) IMPLANT
CATH INFINITI 5FR ANG PIGTAIL (CATHETERS) ×3 IMPLANT
CATH INFINITI JR4 5F (CATHETERS) ×3 IMPLANT
CATH LAUNCHER 6FR EBU3.5 (CATHETERS) ×3 IMPLANT
CATH VISTA GUIDE 6FR JR4 (CATHETERS) ×3 IMPLANT
DEVICE INFLAT 30 PLUS (MISCELLANEOUS) ×3 IMPLANT
DEVICE RAD TR BAND REGULAR (VASCULAR PRODUCTS) ×3 IMPLANT
GLIDESHEATH SLEND SS 6F .021 (SHEATH) ×3 IMPLANT
GUIDEWIRE .025 260CM (WIRE) ×3 IMPLANT
KIT MANI 3VAL PERCEP (MISCELLANEOUS) ×3 IMPLANT
PACK CARDIAC CATH (CUSTOM PROCEDURE TRAY) ×3 IMPLANT
WIRE HITORQ VERSACORE ST 145CM (WIRE) ×3 IMPLANT
WIRE ROSEN-J .035X260CM (WIRE) ×6 IMPLANT
WIRE RUNTHROUGH .014X180CM (WIRE) ×6 IMPLANT

## 2018-06-29 NOTE — Progress Notes (Signed)
Pt arrived to 2H25 via [facility]. Currently denies CP. Dr. Dorris FetchHendrickson notified, says will come to bedside shortly.

## 2018-06-29 NOTE — ED Triage Notes (Signed)
Pt comes via POV from home with c/o chest pain that has been ongoing for about a week. Pt states pain in left arm and left sided chest pain.  Pt states heaviness and nausea. Pt denies any SOB.   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

## 2018-06-29 NOTE — ED Notes (Signed)
4000 units heparin given. 243 ASA given, VORB received from Dr. Pershing ProudSchaevitz.

## 2018-06-29 NOTE — ED Notes (Signed)
Pt transporter to cath lab by this RN and Yetta BarreJones, RN @ [phone]. Bedside report given to Madelaine BhatAdam, RN in cath lab.

## 2018-06-29 NOTE — ED Notes (Signed)
Date and time results received: 06/22/2018 5:19 PM  Test: troponin I Critical Value: 0.11  Name of Provider Notified: Dr. Pershing ProudSchaevitz  Orders Received? Or Actions Taken?: no new orders at this time

## 2018-06-29 NOTE — Progress Notes (Signed)
   06/23/2018 1700  Clinical Encounter Type  Visited With Patient;Family  Visit Type Initial;Spiritual support;Code Valinda Party(Stemi)  Referral From Nurse  Recommendations Follow-up as needed   [facility]plain [facility]. Patient was receiving care as Chaplain arrived. Chaplain spoke with the patient and son and learned that pastoral care was not required at this time. Chaplain offered to return, if needed.

## 2018-06-29 NOTE — Consult Note (Signed)
Reason for Consult:3 vessel CAD Referring Physician: Dr. Rosaland LaoArida  Michael Hess is an 67 y.o. male.  HPI: 67 yo man with a past history of hypertension, hyperlipidemia, CAD, prior PTCA with stents, MI, and ischemic cardiomyopathy. Usual state of health until 2 weeks ago started having exertional chest pressure. Yesterday worsened with 3 episodes with less exertion, still relieved with rest. Today he had prolonged episode and went to ED. St elevation inferiorly taken to cath lab as a STEMI. Pain resolved. Cath showed severe 3 vessel CAD. Currently pain free.  Past Medical History:  Diagnosis Date  . CAD in native artery 06/09/2018  . H/O angioplasty   . Hypercholesteremia   . Hypertension   . Ischemic cardiomyopathy 06/23/2018  . MI (myocardial infarction) (HCC)      No family history on file.  Social History:  has no history on file for tobacco, alcohol, and drug.  Allergies:  Allergies  Allergen Reactions  . Fruit & Vegetable Daily [Nutritional Supplements]     Cherries   . Penicillins     Medications:  Prior to Admission:  Medications Prior to Admission  Medication Sig Dispense Refill Last Dose  . aspirin EC 81 MG tablet Take 81 mg by mouth daily.     Marland Kitchen. losartan (COZAAR) 25 MG tablet Take 25 mg by mouth 2 (two) times daily.     . metoprolol succinate (TOPROL-XL) 25 MG 24 hr tablet Take 25 mg by mouth daily.     . NON FORMULARY Take 1 capsule by mouth every other day. **NON-US MARKET PRODUCT** Meganeuron (mecobalamin/folic acid/alpha lipoic acid)     . NON FORMULARY Take 1 capsule by mouth every other day. **NON-US MARKET PRODUCT** Winofit [employer] Food (omega-3 Fatty Acids/antioxidants/chromium/vitamins)     . rosuvastatin (CRESTOR) 5 MG tablet Take 5 mg by mouth daily.       Results for orders placed or performed during the hospital encounter of 07/02/2018 (from the past 48 hour(s))  Basic metabolic panel     Status: Abnormal   Collection Time: 06/21/2018  4:37 PM   Result Value Ref Range   Sodium 139 135 - 145 mmol/L   Potassium 4.2 3.5 - 5.1 mmol/L   Chloride 106 98 - 111 mmol/L   CO2 26 22 - 32 mmol/L   Glucose, Bld 111 (H) 70 - 99 mg/dL   BUN 22 8 - 23 mg/dL   Creatinine, Ser 1.611.29 (H) 0.61 - 1.24 mg/dL   Calcium 9.2 8.9 - 09.610.3 mg/dL   GFR calc non Af Amer 57 (L) >60 mL/min   GFR calc Af Amer >60 >60 mL/min   Anion gap 7 5 - 15    Comment: Performed at [facility], [address] [place], [place] [address]  CBC     Status: Abnormal   Collection Time: 06/18/2018  4:37 PM  Result Value Ref Range   WBC 9.9 4.0 - 10.5 K/uL   RBC 6.61 (H) 4.22 - 5.81 MIL/uL   Hemoglobin 14.7 13.0 - 17.0 g/dL   HCT 98.147.7 19.139.0 - 47.852.0 %   MCV 72.2 (L) 80.0 - 100.0 fL   MCH 22.2 (L) 26.0 - 34.0 pg   MCHC 30.8 30.0 - 36.0 g/dL   RDW 29.517.4 (H) 62.111.5 - 30.815.5 %   Platelets 197 150 - 400 K/uL   nRBC 0.0 0.0 - 0.2 %    Comment: Performed at [facility], [address] [place], [place] [address]  Troponin I - ONCE -  STAT     Status: Abnormal   Collection Time: 07/01/2018  4:37 PM  Result Value Ref Range   Troponin I 0.11 (HH) <0.03 ng/mL    Comment: CRITICAL RESULT CALLED TO, READ BACK BY AND VERIFIED WITH LISA GRAINGER @1724   06/12/2018 TTG Performed at [facility], [address] [place], [place] [address]   POCT Activated clotting time     Status: None   Collection Time: 07/01/2018  6:08 PM  Result Value Ref Range   Activated Clotting Time 235 seconds    Dg Chest Portable 1 View  Result Date: 06/12/2018 CLINICAL DATA:  Chest pain ongoing for a week. Heaviness and nausea. EXAM: PORTABLE CHEST 1 VIEW COMPARISON:  09/29/2014 FINDINGS: Lung volumes remain slightly low. Retrocardiac streaky parenchymal opacities are noted suspicious for atelectasis. Pneumonia would be difficult to entirely exclude. There is mild diffuse interstitial edema, more pronounced than on prior. Colonic interposition projects over the liver shadow. External  defibrillator paddles project over the cardiac silhouette and epigastric region. No pneumothorax. No acute osseous abnormality. IMPRESSION: Low lung volumes with left basilar atelectasis. Superimposed pneumonia would be difficult to entirely exclude at the left lung base. Mild diffuse interstitial edema. Electronically Signed   By: Tollie Eth M.D.   On: 06/19/2018 17:17    Review of Systems  Constitutional: Positive for malaise/fatigue.  Respiratory: Positive for shortness of breath.   Cardiovascular: Positive for chest pain. Negative for orthopnea and claudication.  Gastrointestinal: Negative for nausea and vomiting.  Genitourinary: Positive for frequency. Negative for dysuria and urgency.  Neurological: Negative for focal weakness and loss of consciousness.  All other systems reviewed and are negative.  There were no vitals taken for this visit. Physical Exam  Vitals reviewed. Constitutional: He is oriented to person, place, and time.  HENT:  Head: Normocephalic and atraumatic.  Mouth/Throat: No oropharyngeal exudate.  Eyes: Conjunctivae and EOM are normal. No scleral icterus.  Neck: No thyromegaly present.  No carotid bruit  Cardiovascular: Normal rate, regular rhythm, normal heart sounds and intact distal pulses. Exam reveals no gallop and no friction rub.  No murmur heard. Respiratory: Effort normal and breath sounds normal. No respiratory distress. He has no wheezes. He has no rales.  GI: Soft. He exhibits no distension. There is no abdominal tenderness.  Musculoskeletal:        General: No edema.  Lymphadenopathy:    He has no cervical adenopathy.  Neurological: He is alert and oriented to person, place, and time. No cranial nerve deficit. He exhibits normal muscle tone. Coordination normal.  Skin: Skin is warm and dry.   Cardiac Catheterization  Mid RCA lesion is 100% stenosed.  Dist LM lesion is 40% stenosed.  Ost LAD to Prox LAD lesion is 99% stenosed.  Mid Cx  lesion is 99% stenosed.  Ost 1st Diag lesion is 100% stenosed.  Prox Cx lesion is 50% stenosed.  There is mild to moderate left ventricular systolic dysfunction.  The left ventricular ejection fraction is 35-45% by visual estimate.  LV end diastolic pressure is moderately elevated.   1.  Severe three-vessel coronary artery disease with what seems to be chronic occlusion of the right coronary artery with left-to-right collaterals and high-grade stenosis in the ostial LAD and mid left circumflex. 2.  Mildly to moderately reduced LV systolic function with an EF of 40% with moderately elevated left ventricular end-diastolic pressure at 21 mmHg.  I personally reviewed the cath images and concur with the findings noted above  Assessment/Plan: 67 yo man with multiple CRF and known CAD presents with an aborted STEMI, troponin + at 0.11. Cath showed severe 3 vessel CAD. CABG indicated for survival benefit and relief of symptoms.  I have discussed the general nature of the procedure, the need for general anesthesia, the use of cardiopulmonary bypass, and the incisions to be used with Dr. Allena KatzPatel and his son. He has a good understanding of the medical issues involved. We discussed the expected hospital stay, overall recovery and short and long term outcomes. They understand the risks include, but are not limited to death, stroke, MI, DVT/PE, bleeding, possible need for transfusion, infections, cardiac arrhythmias, as well as other organ system dysfunction including respiratory, renal, or GI complications.   He accepts the risks and agrees to proceed.  He has been pain free for the last couple of hours.  Will plan surgery 1st case in AM 12/26  Loreli SlotSteven C Kenisha Lynds Jun 19, 2018, 8:00 PM

## 2018-06-29 NOTE — Consult Note (Signed)
Cardiology Consultation:   Patient ID: Michael Hess MRN: [id number]; DOB: December 11, 1950  Admit date: 06/16/2018 Date of Consult: 06/19/2018  Primary Care Provider: Barbette ReichmannHande, Vishwanath, MD Primary Cardiologist: new Primary Electrophysiologist:  n/a   Patient Profile:   Michael Hess is a 67 y.o. male with a hx of coronary artery disease status post stent placement x2 in [place] 15 years ago who is being seen today for the evaluation of chest pain and abnormal EKG at the request of Dr. Pershing ProudSchaevitz.  History of Present Illness:   Michael Hess is a 67 year old male with known history of coronary artery disease.  He reports having 2 stent placements and angina 15 years ago.  No cardiac events since then.  He has other chronic medical conditions that include hypertension and hyperlipidemia.  He reports having intermittent chest pain and shortness of breath over the last few weeks which has been mostly exertional.  He scheduled an appointment with his primary care physician tomorrow but today his chest pain was continuous and thus he came to the ED.  The pain is substernal described as tightness feeling. His EKG showed 1 to 2 mm of ST elevation in the inferior leads with reciprocal ST depression in V3.  Based on this, a code STEMI was activated.  The patient is not a native speaker but does understand [place].  Past Medical History:  Diagnosis Date  . H/O angioplasty   . Hypercholesteremia   . Hypertension   . MI (myocardial infarction) (HCC)        Home Medications:  Prior to Admission medications   Medication Sig Start Date End Date Taking? Authorizing Provider  aspirin EC 81 MG tablet Take 81 mg by mouth daily.   Yes [provider]  losartan (COZAAR) 25 MG tablet Take 25 mg by mouth 2 (two) times daily. 05/30/18  Yes [provider]  metoprolol succinate (TOPROL-XL) 25 MG 24 hr tablet Take 25 mg by mouth daily. 05/30/18  Yes [provider]  NON FORMULARY  Take 1 capsule by mouth every other day. **NON-US MARKET PRODUCT** Meganeuron (mecobalamin/folic acid/alpha lipoic acid)   Yes [provider]  NON FORMULARY Take 1 capsule by mouth every other day. **NON-US MARKET PRODUCT** Winofit Medical Food (omega-3 Fatty Acids/antioxidants/chromium/vitamins)   Yes [provider]  rosuvastatin (CRESTOR) 5 MG tablet Take 5 mg by mouth daily.   Yes [provider]    Inpatient Medications: Scheduled Meds:  Continuous Infusions: . sodium chloride 20 mL/hr at 06/13/2018 1659   PRN Meds: [MAR Hold] nitroGLYCERIN  Allergies:    Allergies  Allergen Reactions  . Fruit & Vegetable Daily [Nutritional Supplements]     Cherries   . Penicillins     Social History:   Social History   Socioeconomic History  . Marital status: Married    Spouse name: Not on file  . Number of children: Not on file  . Years of education: Not on file  . Highest education level: Not on file  Occupational History  . Not on file  Social Needs  . Financial resource strain: Not on file  . Food insecurity:    Worry: Not on file    Inability: Not on file  . Transportation needs:    Medical: Not on file    Non-medical: Not on file  Tobacco Use  . Smoking status: Not on file  Substance and Sexual Activity  . Alcohol use: Not on file  . Drug use: Not on file  . Sexual  activity: Not on file  Lifestyle  . Physical activity:    Days per week: Not on file    Minutes per session: Not on file  . Stress: Not on file  Relationships  . Social connections:    Talks on phone: Not on file    Gets together: Not on file    Attends religious service: Not on file    Active member of club or organization: Not on file    Attends meetings of clubs or organizations: Not on file    Relationship status: Not on file  . Intimate partner violence:    Fear of current or ex partner: Not on file    Emotionally abused: Not on file    Physically abused: Not on  file    Forced sexual activity: Not on file  Other Topics Concern  . Not on file  Social History Narrative  . Not on file    Family History:   Family history is remarkable for coronary artery disease.  The patient is not a smoker.  ROS:  Please see the history of present illness.   All other ROS reviewed and negative.     Physical Exam/Data:   Vitals:   06/21/2018 1704 06/18/2018 1706 06/21/2018 1707 06/21/2018 1734  BP:      Pulse: 81 82 80   Resp: (!) 22 (!) 21 (!) 22   Temp:      TempSrc:      SpO2: 94% 93% 92% 97%  Weight:      Height:       No intake or output data in the 24 hours ending 06/16/2018 1852 Filed Weights   07/02/2018 1632  Weight: 62.1 kg   Body mass index is 25.06 kg/m.  General:  Well nourished, well developed, in no acute distress HEENT: normal Lymph: no adenopathy Neck: no JVD Endocrine:  No thryomegaly Vascular: No carotid bruits; FA pulses 2+ bilaterally without bruits  Cardiac:  normal S1, S2; RRR; no murmur  Lungs:  clear to auscultation bilaterally, no wheezing, rhonchi or rales  Abd: soft, nontender, no hepatomegaly  Ext: no edema Musculoskeletal:  No deformities, BUE and BLE strength normal and equal Skin: warm and dry  Neuro:  CNs 2-12 intact, no focal abnormalities noted Psych:  Normal affect   EKG:  The EKG was personally reviewed and demonstrates: Normal sinus rhythm with 1 to 2 mm of ST elevation in the inferior leads with Q waves in addition to some ST depression in V2 and V3 likely reciprocal changes.   Relevant CV Studies:   Laboratory Data:  Chemistry Recent Labs  Lab 06/24/2018 1637  NA 139  K 4.2  CL 106  CO2 26  GLUCOSE 111*  BUN 22  CREATININE 1.29*  CALCIUM 9.2  GFRNONAA 57*  GFRAA >60  ANIONGAP 7    No results for input(s): PROT, ALBUMIN, AST, ALT, ALKPHOS, BILITOT in the last 168 hours. Hematology Recent Labs  Lab 06/16/2018 1637  WBC 9.9  RBC 6.61*  HGB 14.7  HCT 47.7  MCV 72.2*  MCH 22.2*  MCHC 30.8    RDW 17.4*  PLT 197   Cardiac Enzymes Recent Labs  Lab 06/26/2018 1637  TROPONINI 0.11*   No results for input(s): TROPIPOC in the last 168 hours.  BNPNo results for input(s): BNP, PROBNP in the last 168 hours.  DDimer No results for input(s): DDIMER in the last 168 hours.  Radiology/Studies:  Dg Chest Portable 1 View  Result Date:  07/02/2018 CLINICAL DATA:  Chest pain ongoing for a week. Heaviness and nausea. EXAM: PORTABLE CHEST 1 VIEW COMPARISON:  09/29/2014 FINDINGS: Lung volumes remain slightly low. Retrocardiac streaky parenchymal opacities are noted suspicious for atelectasis. Pneumonia would be difficult to entirely exclude. There is mild diffuse interstitial edema, more pronounced than on prior. Colonic interposition projects over the liver shadow. External defibrillator paddles project over the cardiac silhouette and epigastric region. No pneumothorax. No acute osseous abnormality. IMPRESSION: Low lung volumes with left basilar atelectasis. Superimposed pneumonia would be difficult to entirely exclude at the left lung base. Mild diffuse interstitial edema. Electronically Signed   By: Tollie Ethavid  Kwon M.D.   On: 06/23/2018 17:17    Assessment and Plan:   1. Possible acute inferior ST elevation myocardial infarction: The patient presents with intermittent anginal symptoms over the last few weeks that intensified today.  His EKG shows minor ST elevation in the inferior leads but he also has Q waves there.  I do not have a previous EKG to compare.  Based on his symptoms and EKG changes, a code STEMI was activated and the patient underwent emergent cardiac catheterization via the right radial artery.  Catheterization showed a chronically occluded right coronary artery at the previously placed stent with well-developed left-to-right collaterals.  He was noted to have 99% ostial/proximal LAD stenosis and 99% stenosis in the mid left circumflex.  His EF was around 40% with moderate inferior wall  hypokinesis.  Given severe three-vessel disease and cardiomyopathy, I think his best option is CABG.  I discussed the case with Dr. Dorris FetchHendrickson and Dr. Jacques NavyAcharya and the patient was accepted to [facility], ICU.  He has a TR band on the right radial the patient was hypertensive in the Cath Lab and was started on nitroglycerin drip.  He was chest pain-free.  Recommend starting heparin drip 6 hours after sheath pull.  Continue aspirin.  Recommend starting carvedilol for blood pressure control.  Recommend high-dose statin. 2. Acute systolic heart failure due to ischemic cardiomyopathy: LVEDP was 22.  The patient appears to be relatively comfortable and has no orthopnea.  Can diuresis as needed based on his symptoms.   For questions or updates, please contact [facility] Please consult [email or web address] for contact info under     Signed, Lorine BearsMuhammad Fraida Veldman, MD  06/15/2018 6:52 PM

## 2018-06-29 NOTE — ED Notes (Signed)
Pt placed on 2L O2 via n/c for O2 sat 91% on RA.

## 2018-06-29 NOTE — ED Notes (Addendum)
Pt taken back to room 15. Pads applied, pt undressed with assistance from EDT [facility] and [employer] Megan. MD at bedside, RN Helmut MusterAlicia at bedside.

## 2018-06-29 NOTE — ED Provider Notes (Addendum)
[facility] Emergency Department Provider Note  ____________________________________________   First MD Initiated Contact with Patient 07/11/18 1653     (approximate)  I have reviewed the triage vital signs and the nursing notes.   HISTORY  Chief Complaint Chest Pain   HPI Michael Hess is a 67 y.o. male with a history of hypertension, hypercholesterolemia and MI with angioplasty has presented emergency department with 1 hour of pressure-like chest pain to the left side of his chest radiating to his left arm.  Patient says that over the past several weeks that he has had exertional pressure to his chest especially when going upstairs.  15 years ago the patient reports an angioplasty with stenting x2 in [place].  He says that he is taking 81 mg of aspirin at this time but is not on Plavix.  Says that he has been trying to get into see her primary care doctor but had an appointment on January 17 and has not been able to see her primary care doctor sooner.  Denies any nausea or vomiting.   Past Medical History:  Diagnosis Date  . H/O angioplasty   . Hypercholesteremia   . Hypertension   . MI (myocardial infarction) (HCC)     There are no active problems to display for this patient.    Prior to Admission medications   Not on File    Allergies Fruit & vegetable daily [nutritional supplements] and Penicillins  No family history on file.  Social History Social History   Tobacco Use  . Smoking status: Not on file  Substance Use Topics  . Alcohol use: Not on file  . Drug use: Not on file    Review of Systems  Constitutional: No fever/chills Eyes: No visual changes. ENT: No sore throat. Cardiovascular: As above Respiratory: Denies shortness of breath. Gastrointestinal: No abdominal pain.  No nausea, no vomiting.  No diarrhea.  No constipation. Genitourinary: Negative for dysuria. Musculoskeletal: Negative for back pain. Skin: Negative for  rash. Neurological: Negative for headaches, focal weakness or numbness.   ____________________________________________   PHYSICAL EXAM:  VITAL SIGNS: ED Triage Vitals [07-11-2018 1632]  Enc Vitals Group     BP (!) 161/105     Pulse Rate 80     Resp 18     Temp (!) 97.5 F (36.4 C)     Temp Source Oral     SpO2 99 %     Weight 137 lb (62.1 kg)     Height 5\' 2"  (1.575 m)     Head Circumference      Peak Flow      Pain Score 5     Pain Loc      Pain Edu?      Excl. in GC?     Constitutional: Alert and oriented.  Appears uncomfortable.  Eyes: Conjunctivae are normal.  Head: Atraumatic. Nose: No congestion/rhinnorhea. Mouth/Throat: Mucous membranes are moist.  Neck: No stridor.   Cardiovascular: Normal rate, regular rhythm. Grossly normal heart sounds.  Good peripheral circulation with equal and bilateral radial pulses. Respiratory: Normal respiratory effort.  No retractions. Lungs CTAB. Gastrointestinal: Soft and nontender. No distention.  Musculoskeletal: No lower extremity tenderness nor edema.  No joint effusions. Neurologic:  Normal speech and language. No gross focal neurologic deficits are appreciated. Skin:  Skin is warm, dry and intact. No rash noted. Psychiatric: Mood and affect are normal. Speech and behavior are normal.  ____________________________________________   LABS (all labs ordered are listed, but  only abnormal results are displayed)  Labs Reviewed  CBC - Abnormal; Notable for the following components:      Result Value   RBC 6.61 (*)    MCV 72.2 (*)    MCH 22.2 (*)    RDW 17.4 (*)    All other components within normal limits  BASIC METABOLIC PANEL  TROPONIN I  PROTIME-INR  APTT  LIPID PANEL  COMPREHENSIVE METABOLIC PANEL   ____________________________________________  EKG  ED ECG REPORT I, Arelia Longestavid M Schaevitz, the attending physician, personally viewed and interpreted this ECG.   Date: 06/16/2018  EKG Time: 1651  Rate: 99  Rhythm:  normal sinus rhythm  Axis: Normal  Intervals:none  ST&T Change: ST depressions in 1 as well as aVL of 3 mm with elevations of 3 mm in lead III.  2 mm elevation in aVF.  1 to 2 mm elevation in lead II.  3 mm depression in V3 with T wave inversions in V5 and V6.  ED ECG REPORT I, Arelia Longestavid M Schaevitz, the attending physician, personally viewed and interpreted this ECG.   Date: 06/12/2018  EKG Time: 1642  Rate: 78  Rhythm: normal sinus rhythm  Axis: Normal  Intervals:none  ST&T Change: 2 to 3 mm ST elevation in 2 3 and aVF.  1 mm depression in aVL as well as V2.  1 mm depression in V5 and V6.  ED ECG REPORT I, Arelia Longestavid M Schaevitz, the attending physician, personally viewed and interpreted this ECG.   Date: 06/28/2018  EKG Time: 1715  Rate: 83  Rhythm: normal sinus rhythm  Axis: Normal axis  Intervals:Prolonged QTC at 534.  ST&T Change: Persistent elevations in 2 3 and aVF with depressions in aVL as well as V2 and V3.  ____________________________________________  RADIOLOGY  Chest x-ray without acute process.  Reviewed by me. ____________________________________________   PROCEDURES  Procedure(s) performed:   .Critical Care Performed by: Myrna BlazerSchaevitz, David Matthew, MD Authorized by: Myrna BlazerSchaevitz, David Matthew, MD   Critical care provider statement:    Critical care time (minutes):  35   Critical care time was exclusive of:  Separately billable procedures and treating other patients   Critical care was necessary to treat or prevent imminent or life-threatening deterioration of the following conditions: STEMI.   Critical care was time spent personally by me on the following activities:  Development of treatment plan with patient or surrogate, discussions with consultants, evaluation of patient's response to treatment, examination of patient, obtaining history from patient or surrogate, ordering and performing treatments and interventions, ordering and review of laboratory studies,  ordering and review of radiographic studies, pulse oximetry, re-evaluation of patient's condition and review of old charts    Critical Care performed:   ____________________________________________   INITIAL IMPRESSION / ASSESSMENT AND PLAN / ED COURSE  Pertinent labs & imaging results that were available during my care of the patient were reviewed by me and considered in my medical decision making (see chart for details).  Differential diagnosis includes, but is not limited to, ACS, aortic dissection, pulmonary embolism, cardiac tamponade, pneumothorax, pneumonia, pericarditis, myocarditis, GI-related causes including esophagitis/gastritis, and musculoskeletal chest wall pain.   As part of my medical decision making, I reviewed the following data within the electronic MEDICAL RECORD NUMBER Notes from prior ED visits as well as records   ----------------------------------------- 5:07 PM on 07/02/2018 -----------------------------------------  STEMI alert called immediate upon receipt of initial EKG at 1642.  Discussed the case with Dr. Kirke CorinArida who will be taking the patient  to the Cath Lab.  4000 units of heparin given as well as aspirin.  Nitro ordered as well.  ----------------------------------------- 5:21 PM on 06/22/2018 -----------------------------------------  Patient now pain-free but with persistent elevations and without significant change in EKG.  Dr. Kirke CorinArida now at the bedside.  ____________________________________________   FINAL CLINICAL IMPRESSION(S) / ED DIAGNOSES  Final diagnoses:  ST elevation myocardial infarction (STEMI), unspecified artery (HCC)      NEW MEDICATIONS STARTED DURING THIS VISIT:  New Prescriptions   No medications on file     Note:  This document was prepared using Dragon voice recognition software and may include unintentional dictation errors.     Myrna BlazerSchaevitz, David Matthew, MD 06/23/2018 1711    Pershing ProudSchaevitz, Myra Rudeavid Matthew, MD 06/18/2018  [phone]

## 2018-06-29 NOTE — ED Notes (Signed)
Cardiologist Arida at bedside 

## 2018-06-29 NOTE — ED Notes (Signed)
Pt O2 sats remained 91% on 2L, placed on 4L via [place], O2 sats 95%.

## 2018-06-29 NOTE — Progress Notes (Signed)
ANTICOAGULATION CONSULT NOTE - Initial Consult  Pharmacy Consult for Heparin  Indication: chest pain/ACS  Allergies  Allergen Reactions  . Fruit & Vegetable Daily [Nutritional Supplements]     Cherries   . Penicillins     Patient Measurements:     Vital Signs: Temp: 98.8 F (37.1 C) (12/25 2000) Temp Source: Oral (12/25 2000) BP: 151/102 (12/25 2130) Pulse Rate: 66 (12/25 2130)  Labs: Recent Labs    07/02/2018 1637 06/17/2018 2024  HGB 14.7  --   HCT 47.7  --   PLT 197  --   LABPROT  --  13.8  INR  --  1.07  CREATININE 1.29*  --   TROPONINI 0.11* 3.98*    Estimated Creatinine Clearance: 42.3 mL/min (A) (by C-G formula based on SCr of 1.29 mg/dL (H)).   Medical History: Past Medical History:  Diagnosis Date  . CAD in native artery 06/19/2018  . H/O angioplasty   . Hypercholesteremia   . Hypertension   . Ischemic cardiomyopathy 07/01/2018  . MI (myocardial infarction) [facility](HCC)       Assessment: 67yom with Hx CAD admitted with STEMI found to have 3V CAD and plan for CABG in the morning.  Plan to start heparin drip over night and turn off on call to OR. Sheath out 1800 start heparin at [facility].  Goal of Therapy:  Heparin level 0.3-0.7 units/ml Monitor platelets by anticoagulation protocol: Yes   Plan:  Heparin drip 600 uts/hr start at MN  Turn heparin off in am on call to OR  Leota SauersLisa Jariel Drost Pharm.D. CPP, BCPS Clinical Pharmacist [phone] 06/11/2018 9:44 PM

## 2018-06-29 NOTE — ED Notes (Signed)
EKG performed took back to Dr. Pershing ProudSchaevitz. Per verbal order pt needs to be brought back and start code STEMI protocals.. Informed Sam RN who called charged to get room assignment.

## 2018-06-29 NOTE — H&P (Addendum)
HISTORY AND PHYSICAL  Copy of Dr. Jari SportsmanArida's consult done this evening prior to transfer.  Patient ID: Michael Hess MRN: [id number]; DOB: 04-23-1951  Admit date: Apr 06, 2018 Date of Consult: Apr 06, 2018  Primary Care Provider: Barbette ReichmannHande, Vishwanath, MD Primary Cardiologist: new Primary Electrophysiologist:  n/a   Patient Profile:   Michael Hess is a 67 y.o. male with a hx of coronary artery disease status post stent placement x2 in [place] 15 years ago who is being seen today for the evaluation of chest pain and abnormal EKG at the request of Dr. Pershing ProudSchaevitz.  History of Present Illness:   Mr. Allena Hess is a 67 year old male with known history of coronary artery disease.  He reports having 2 stent placements and angina 15 years ago.  No cardiac events since then.  He has other chronic medical conditions that include hypertension and hyperlipidemia.  He reports having intermittent chest pain and shortness of breath over the last few weeks which has been mostly exertional.  He scheduled an appointment with his primary care physician tomorrow but today his chest pain was continuous and thus he came to the ED.  The pain is substernal described as tightness feeling. His EKG showed 1 to 2 mm of ST elevation in the inferior leads with reciprocal ST depression in V3.  Based on this, a code STEMI was activated.  The patient is not a native speaker but does understand [place].      Past Medical History:  Diagnosis Date  . H/O angioplasty   . Hypercholesteremia   . Hypertension   . MI (myocardial infarction) (HCC)        Home Medications:         Prior to Admission medications   Medication Sig Start Date End Date Taking? Authorizing Provider  aspirin EC 81 MG tablet Take 81 mg by mouth daily.   Yes [provider]  losartan (COZAAR) 25 MG tablet Take 25 mg by mouth 2 (two) times daily. 05/30/18  Yes [provider]  metoprolol succinate (TOPROL-XL) 25 MG  24 hr tablet Take 25 mg by mouth daily. 05/30/18  Yes [provider]  NON FORMULARY Take 1 capsule by mouth every other day. **NON-US MARKET PRODUCT** Meganeuron (mecobalamin/folic acid/alpha lipoic acid)   Yes [provider]  NON FORMULARY Take 1 capsule by mouth every other day. **NON-US MARKET PRODUCT** Win[employer] (omega-3 Fatty Acids/antioxidants/chromium/vitamins)   Yes [provider]  rosuvastatin (CRESTOR) 5 MG tablet Take 5 mg by mouth daily.   Yes [provider]    Inpatient Medications: Scheduled Meds: Continuous Infusions: . sodium chloride 20 mL/hr at 02/27/18 1659   PRN Meds: [MAR Hold] nitroGLYCERIN  Allergies:         Allergies  Allergen Reactions  . Fruit & Vegetable Daily [Nutritional Supplements]     Cherries   . Penicillins     Social History:   Social History        Socioeconomic History  . Marital status: Married    Spouse name: Not on file  . Number of children: Not on file  . Years of education: Not on file  . Highest education level: Not on file  Occupational History  . Not on file  Social Needs  . Financial resource strain: Not on file  . Food insecurity:    Worry: Not on file    Inability: Not on file  . Transportation needs:    Medical: Not on file    Non-medical: Not  on file  Tobacco Use  . Smoking status: Not on file  Substance and Sexual Activity  . Alcohol use: Not on file  . Drug use: Not on file  . Sexual activity: Not on file  Lifestyle  . Physical activity:    Days per week: Not on file    Minutes per session: Not on file  . Stress: Not on file  Relationships  . Social connections:    Talks on phone: Not on file    Gets together: Not on file    Attends religious service: Not on file    Active member of club or organization: Not on file    Attends meetings of clubs or organizations: Not on file    Relationship status: Not on  file  . Intimate partner violence:    Fear of current or ex partner: Not on file    Emotionally abused: Not on file    Physically abused: Not on file    Forced sexual activity: Not on file  Other Topics Concern  . Not on file  Social History Narrative  . Not on file    Family History:   Family history is remarkable for coronary artery disease.  The patient is not a smoker.  ROS:  Please see the history of present illness.   All other ROS reviewed and negative.     Physical Exam/Data:         Vitals:   07/05/2018 1704 06/17/2018 1706 06/08/2018 1707 06/22/2018 1734  BP:      Pulse: 81 82 80   Resp: (!) 22 (!) 21 (!) 22   Temp:      TempSrc:      SpO2: 94% 93% 92% 97%  Weight:      Height:       No intake or output data in the 24 hours ending 06/26/2018 1852    Filed Weights   06/09/2018 1632  Weight: 62.1 kg   Body mass index is 25.06 kg/m.  General:  Well nourished, well developed, in no acute distress HEENT: normal Lymph: no adenopathy Neck: no JVD Endocrine:  No thryomegaly Vascular: No carotid bruits; FA pulses 2+ bilaterally without bruits  Cardiac:  normal S1, S2; RRR; no murmur  Lungs:  clear to auscultation bilaterally, no wheezing, rhonchi or rales  Abd: soft, nontender, no hepatomegaly  Ext: no edema Musculoskeletal:  No deformities, BUE and BLE strength normal and equal Skin: warm and dry  Neuro:  CNs 2-12 intact, no focal abnormalities noted Psych:  Normal affect   EKG:  The EKG was personally reviewed and demonstrates: Normal sinus rhythm with 1 to 2 mm of ST elevation in the inferior leads with Q waves in addition to some ST depression in V2 and V3 likely reciprocal changes.   Relevant CV Studies:   Laboratory Data:  Chemistry LastLabs     Recent Labs  Lab 06/12/2018 1637  NA 139  K 4.2  CL 106  CO2 26  GLUCOSE 111*  BUN 22  CREATININE 1.29*  CALCIUM 9.2  GFRNONAA 57*  GFRAA >60  ANIONGAP 7       LastLabs  No results for input(s): PROT, ALBUMIN, AST, ALT, ALKPHOS, BILITOT in the last 168 hours.   Hematology LastLabs     Recent Labs  Lab 06/06/2018 1637  WBC 9.9  RBC 6.61*  HGB 14.7  HCT 47.7  MCV 72.2*  MCH 22.2*  MCHC 30.8  RDW 17.4*  PLT 197  Cardiac Enzymes LastLabs     Recent Labs  Lab 2018-07-09 1637  TROPONINI 0.11*      LastLabs  No results for input(s): TROPIPOC in the last 168 hours.    BNP LastLabs  No results for input(s): BNP, PROBNP in the last 168 hours.    DDimer  Beau Fanny  No results for input(s): DDIMER in the last 168 hours.    Radiology/Studies:  Dg Chest Portable 1 View  Result Date: 09-Jul-2018 CLINICAL DATA:  Chest pain ongoing for a week. Heaviness and nausea. EXAM: PORTABLE CHEST 1 VIEW COMPARISON:  09/29/2014 FINDINGS: Lung volumes remain slightly low. Retrocardiac streaky parenchymal opacities are noted suspicious for atelectasis. Pneumonia would be difficult to entirely exclude. There is mild diffuse interstitial edema, more pronounced than on prior. Colonic interposition projects over the liver shadow. External defibrillator paddles project over the cardiac silhouette and epigastric region. No pneumothorax. No acute osseous abnormality. IMPRESSION: Low lung volumes with left basilar atelectasis. Superimposed pneumonia would be difficult to entirely exclude at the left lung base. Mild diffuse interstitial edema. Electronically Signed   By: Tollie Eth M.D.   On: 2018/07/09 17:17    Assessment and Plan:   1. Possible acute inferior ST elevation myocardial infarction: The patient presents with intermittent anginal symptoms over the last few weeks that intensified today.  His EKG shows minor ST elevation in the inferior leads but he also has Q waves there.  I do not have a previous EKG to compare.  Based on his symptoms and EKG changes, a code STEMI was activated and the patient underwent emergent cardiac  catheterization via the right radial artery.  Catheterization showed a chronically occluded right coronary artery at the previously placed stent with well-developed left-to-right collaterals.  He was noted to have 99% ostial/proximal LAD stenosis and 99% stenosis in the mid left circumflex.  His EF was around 40% with moderate inferior wall hypokinesis.  Given severe three-vessel disease and cardiomyopathy, I think his best option is CABG.  I discussed the case with Dr. Dorris Fetch and Dr. Jacques Navy and the patient was accepted to transfer to [facility], ICU.  He has a TR band on the right radial the patient was hypertensive in the Cath Lab and was started on nitroglycerin drip.  He was chest pain-free.  Recommend starting heparin drip 6 hours after sheath pull.  Continue aspirin.  Recommend starting carvedilol for blood pressure control.  Recommend high-dose statin. 2. Acute systolic heart failure due to ischemic cardiomyopathy: LVEDP was 22.  The patient appears to be relatively comfortable and has no orthopnea.  Can diuresis as needed based on his symptoms.   For questions or updates, please contact [facility] Please consult [email or web address] for contact info under     Signed, Lorine Bears, MD  2018-07-09 6:52 PM   Pt transferred emergently to [facility].  Plan for IV heparin 6 hours after sheath pull, will add high dose statin and BB unless he is taken to OR.  On IV NTG at 5 mcg.    Cardiac cath 09-Jul-2018   Mid RCA lesion is 100% stenosed.  Dist LM lesion is 40% stenosed.  Ost LAD to Prox LAD lesion is 99% stenosed.  Mid Cx lesion is 99% stenosed.  Ost 1st Diag lesion is 100% stenosed.  Prox Cx lesion is 50% stenosed.  There is mild to moderate left ventricular systolic dysfunction.  The left ventricular ejection fraction is 35-45% by visual estimate.  LV end diastolic pressure is moderately elevated.  1.  Severe three-vessel coronary artery disease with what seems to be  chronic occlusion of the right coronary artery with left-to-right collaterals and high-grade stenosis in the ostial LAD and mid left circumflex. 2.  Mildly to moderately reduced LV systolic function with an EF of 40% with moderately elevated left ventricular end-diastolic pressure at 21 mmHg.  Recommendations: Based on the patient's coronary anatomy, I recommend urgent CABG.  I discussed the case with Dr. Dorris Fetch who accepted the patient.  The patient will be transferred to [facility].  The patient did not receive any P2 Y 12 inhibitors.  If the patient does not go to the OR tonight, I recommend resuming heparin drip in 6 hours. The patient was chest pain-free at the end of the case and given his elevated blood pressure, I started him on nitroglycerin drip.  The left ventricle is mildly dilated. There is mild to moderate left ventricular systolic dysfunction. LV end diastolic pressure is moderately elevated. The left ventricular ejection fraction is 35-45% by visual estimate. There are LV function abnormalities due to segmental dysfunction.  Diagnostic  Dominance: Right        PE  BP 155/106 P 75 R 19 SP02 96 on 2 L [place] General:Pleasant affect, NAD Skin:Warm and dry, brisk capillary refill HEENT:normocephalic, sclera clear, mucus membranes moist Neck:supple, no JVD, no bruits  Heart:S1S2 RRR without murmur, gallup, rub or click Lungs:clear to diminished ant.  without rales, rhonchi, or wheezes WUJ:WJXB, non tender, + BS, do not palpate liver spleen or masses Ext:no lower ext edema, 2+ pedal pulses, 2+ radial pulses Neuro:alert and oriented X 3, MAE, follows commands, + facial symmetry  English is weak, family has been translating at [employer]   Na 139 K+ 4.2 BUN 22  Cr 1.29 Troponin 0.11 Hgb 14.7 Hct 47.7 plts 197 WBC 9.9  CXR Low lung volumes with left basilar atelectasis. Superimposed pneumonia would be difficult to entirely exclude at the left lung base. Mild diffuse interstitial  edema.  No change in assessment and plan.  OR per Dr. Dorris Fetch.     I have seen, examined the patient, and reviewed the above assessment and plan.  Changes to above are made where necessary.  On exam, RRR.  Currently chest pain free and comfortable.  I have spoken with Dr Kirke Corin by phone.  I have also discussed at bedside this evening with Dr Dorris Fetch.  We all three agree that it is reasonable to wait until am to proceed with CABG.  If he has any worsening overnight, then Dr Dorris Fetch is willing to take to the OR more urgently. Resume heparin drip 6 hours post cath.  On IV nitro. Metoprolol for HR/BP overnight.  Co Sign: Hillis Range, MD 06/30/2018 8:05 PM

## 2018-06-29 NOTE — ED Notes (Signed)
Full rainbow sent to lab.  

## 2018-06-29 NOTE — ED Notes (Signed)
Sent rainbow on pt to the lab.

## 2018-06-29 NOTE — H&P (View-Only) (Signed)
Reason for Consult:3 vessel CAD Referring Physician: Dr. Arida  Michael Hess is an 67 y.o. male.  HPI: 67 yo man with a past history of hypertension, hyperlipidemia, CAD, prior PTCA with stents, MI, and ischemic cardiomyopathy. Usual state of health until 2 weeks ago started having exertional chest pressure. Yesterday worsened with 3 episodes with less exertion, still relieved with rest. Today he had prolonged episode and went to ED. St elevation inferiorly taken to cath lab as a STEMI. Pain resolved. Cath showed severe 3 vessel CAD. Currently pain free.  Past Medical History:  Diagnosis Date  . CAD in native artery 06/28/2018  . H/O angioplasty   . Hypercholesteremia   . Hypertension   . Ischemic cardiomyopathy 06/22/2018  . MI (myocardial infarction) (HCC)      No family history on file.  Social History:  has no history on file for tobacco, alcohol, and drug.  Allergies:  Allergies  Allergen Reactions  . Fruit & Vegetable Daily [Nutritional Supplements]     Cherries   . Penicillins     Medications:  Prior to Admission:  Medications Prior to Admission  Medication Sig Dispense Refill Last Dose  . aspirin EC 81 MG tablet Take 81 mg by mouth daily.     . losartan (COZAAR) 25 MG tablet Take 25 mg by mouth 2 (two) times daily.     . metoprolol succinate (TOPROL-XL) 25 MG 24 hr tablet Take 25 mg by mouth daily.     . NON FORMULARY Take 1 capsule by mouth every other day. **NON-US MARKET PRODUCT** Meganeuron (mecobalamin/folic acid/alpha lipoic acid)     . NON FORMULARY Take 1 capsule by mouth every other day. **NON-US MARKET PRODUCT** [employer] (omega-3 Fatty Acids/antioxidants/chromium/vitamins)     . rosuvastatin (CRESTOR) 5 MG tablet Take 5 mg by mouth daily.       Results for orders placed or performed during the hospital encounter of 06/09/2018 (from the past 48 hour(s))  Basic metabolic panel     Status: Abnormal   Collection Time: 07/02/2018  4:37 PM   Result Value Ref Range   Sodium 139 135 - 145 mmol/L   Potassium 4.2 3.5 - 5.1 mmol/L   Chloride 106 98 - 111 mmol/L   CO2 26 22 - 32 mmol/L   Glucose, Bld 111 (H) 70 - 99 mg/dL   BUN 22 8 - 23 mg/dL   Creatinine, Ser 1.29 (H) 0.61 - 1.24 mg/dL   Calcium 9.2 8.9 - 10.3 mg/dL   GFR calc non Af Amer 57 (L) >60 mL/min   GFR calc Af Amer >60 >60 mL/min   Anion gap 7 5 - 15    Comment: Performed at [facility], [address] [place], [place] [address]  CBC     Status: Abnormal   Collection Time: 06/07/2018  4:37 PM  Result Value Ref Range   WBC 9.9 4.0 - 10.5 K/uL   RBC 6.61 (H) 4.22 - 5.81 MIL/uL   Hemoglobin 14.7 13.0 - 17.0 g/dL   HCT 47.7 39.0 - 52.0 %   MCV 72.2 (L) 80.0 - 100.0 fL   MCH 22.2 (L) 26.0 - 34.0 pg   MCHC 30.8 30.0 - 36.0 g/dL   RDW 17.4 (H) 11.5 - 15.5 %   Platelets 197 150 - 400 K/uL   nRBC 0.0 0.0 - 0.2 %    Comment: Performed at [facility], [address] [place], [place] [address]  Troponin I - ONCE -   STAT     Status: Abnormal   Collection Time: 06/21/2018  4:37 PM  Result Value Ref Range   Troponin I 0.11 (HH) <0.03 ng/mL    Comment: CRITICAL RESULT CALLED TO, READ BACK BY AND VERIFIED WITH LISA GRAINGER @1724  06/25/2018 TTG Performed at [facility], [address] [place], [place] [address]   POCT Activated clotting time     Status: None   Collection Time: 06/13/2018  6:08 PM  Result Value Ref Range   Activated Clotting Time 235 seconds    Dg Chest Portable 1 View  Result Date: 06/24/2018 CLINICAL DATA:  Chest pain ongoing for a week. Heaviness and nausea. EXAM: PORTABLE CHEST 1 VIEW COMPARISON:  09/29/2014 FINDINGS: Lung volumes remain slightly low. Retrocardiac streaky parenchymal opacities are noted suspicious for atelectasis. Pneumonia would be difficult to entirely exclude. There is mild diffuse interstitial edema, more pronounced than on prior. Colonic interposition projects over the liver shadow. External  defibrillator paddles project over the cardiac silhouette and epigastric region. No pneumothorax. No acute osseous abnormality. IMPRESSION: Low lung volumes with left basilar atelectasis. Superimposed pneumonia would be difficult to entirely exclude at the left lung base. Mild diffuse interstitial edema. Electronically Signed   By: David  Kwon M.D.   On: 06/24/2018 17:17    Review of Systems  Constitutional: Positive for malaise/fatigue.  Respiratory: Positive for shortness of breath.   Cardiovascular: Positive for chest pain. Negative for orthopnea and claudication.  Gastrointestinal: Negative for nausea and vomiting.  Genitourinary: Positive for frequency. Negative for dysuria and urgency.  Neurological: Negative for focal weakness and loss of consciousness.  All other systems reviewed and are negative.  There were no vitals taken for this visit. Physical Exam  Vitals reviewed. Constitutional: He is oriented to person, place, and time.  HENT:  Head: Normocephalic and atraumatic.  Mouth/Throat: No oropharyngeal exudate.  Eyes: Conjunctivae and EOM are normal. No scleral icterus.  Neck: No thyromegaly present.  No carotid bruit  Cardiovascular: Normal rate, regular rhythm, normal heart sounds and intact distal pulses. Exam reveals no gallop and no friction rub.  No murmur heard. Respiratory: Effort normal and breath sounds normal. No respiratory distress. He has no wheezes. He has no rales.  GI: Soft. He exhibits no distension. There is no abdominal tenderness.  Musculoskeletal:        General: No edema.  Lymphadenopathy:    He has no cervical adenopathy.  Neurological: He is alert and oriented to person, place, and time. No cranial nerve deficit. He exhibits normal muscle tone. Coordination normal.  Skin: Skin is warm and dry.   Cardiac Catheterization  Mid RCA lesion is 100% stenosed.  Dist LM lesion is 40% stenosed.  Ost LAD to Prox LAD lesion is 99% stenosed.  Mid Cx  lesion is 99% stenosed.  Ost 1st Diag lesion is 100% stenosed.  Prox Cx lesion is 50% stenosed.  There is mild to moderate left ventricular systolic dysfunction.  The left ventricular ejection fraction is 35-45% by visual estimate.  LV end diastolic pressure is moderately elevated.   1.  Severe three-vessel coronary artery disease with what seems to be chronic occlusion of the right coronary artery with left-to-right collaterals and high-grade stenosis in the ostial LAD and mid left circumflex. 2.  Mildly to moderately reduced LV systolic function with an EF of 40% with moderately elevated left ventricular end-diastolic pressure at 21 mmHg.  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 Cath showed severe 3 vessel CAD. CABG indicated for survival benefit and relief of symptoms.  I have discussed the general nature of the procedure, the need for general anesthesia, the use of cardiopulmonary bypass, and the incisions to be used with Dr. Yore and his son. He has a good understanding of the medical issues involved. We discussed the expected hospital stay, overall recovery and short and long term outcomes. They understand the risks include, but are not limited to death, stroke, MI, DVT/PE, bleeding, possible need for transfusion, infections, cardiac arrhythmias, as well as other organ system dysfunction including respiratory, renal, or GI complications.   He accepts the risks and agrees to proceed.  He has been pain free for the last couple of hours.  Will plan surgery 1st case in AM 12/26  Naveah Brave C Claudetta Sallie 06/24/2018, 8:00 PM     

## 2018-06-30 ENCOUNTER — Inpatient Hospital Stay (HOSPITAL_COMMUNITY): Payer: Medicare Other

## 2018-06-30 ENCOUNTER — Other Ambulatory Visit: Payer: Self-pay

## 2018-06-30 ENCOUNTER — Inpatient Hospital Stay (HOSPITAL_COMMUNITY): Payer: Medicare Other | Admitting: Certified Registered Nurse Anesthetist

## 2018-06-30 ENCOUNTER — Inpatient Hospital Stay (HOSPITAL_COMMUNITY)
Admission: EM | Disposition: E | Payer: Self-pay | Source: Other Acute Inpatient Hospital | Attending: Thoracic Surgery (Cardiothoracic Vascular Surgery)

## 2018-06-30 DIAGNOSIS — Z951 Presence of aortocoronary bypass graft: Secondary | ICD-10-CM

## 2018-06-30 DIAGNOSIS — I213 ST elevation (STEMI) myocardial infarction of unspecified site: Secondary | ICD-10-CM

## 2018-06-30 DIAGNOSIS — I25110 Atherosclerotic heart disease of native coronary artery with unstable angina pectoris: Secondary | ICD-10-CM

## 2018-06-30 HISTORY — PX: TEE WITHOUT CARDIOVERSION: SHX5443

## 2018-06-30 HISTORY — PX: ENDOVEIN HARVEST OF GREATER SAPHENOUS VEIN: SHX5059

## 2018-06-30 HISTORY — PX: CORONARY ARTERY BYPASS GRAFT: SHX141

## 2018-06-30 LAB — POCT I-STAT 3, ART BLOOD GAS (G3+)
ACID-BASE DEFICIT: 2 mmol/L (ref 0.0–2.0)
Acid-base deficit: 4 mmol/L — ABNORMAL HIGH (ref 0.0–2.0)
Acid-base deficit: 5 mmol/L — ABNORMAL HIGH (ref 0.0–2.0)
Acid-base deficit: 7 mmol/L — ABNORMAL HIGH (ref 0.0–2.0)
Acid-base deficit: 3 mmol/L — ABNORMAL HIGH (ref 0.0–2.0)
Bicarbonate: 22.5 mmol/L (ref 20.0–28.0)
Bicarbonate: 24.1 mmol/L (ref 20.0–28.0)
Bicarbonate: 20.5 mmol/L (ref 20.0–28.0)
Bicarbonate: 19.3 mmol/L — ABNORMAL LOW (ref 20.0–28.0)
Bicarbonate: 22.6 mmol/L (ref 20.0–28.0)
O2 Saturation: 100 %
O2 Saturation: 100 %
O2 Saturation: 99 %
O2 Saturation: 99 %
O2 Saturation: 99 %
PO2 ART: 149 mmHg — AB (ref 83.0–108.0)
Patient temperature: 35.5
Patient temperature: 36.7
TCO2: 24 mmol/L (ref 22–32)
TCO2: 25 mmol/L (ref 22–32)
TCO2: 22 mmol/L (ref 22–32)
TCO2: 21 mmol/L — ABNORMAL LOW (ref 22–32)
TCO2: 24 mmol/L (ref 22–32)
pCO2 arterial: 45.8 mmHg (ref 32.0–48.0)
pCO2 arterial: 47.5 mmHg (ref 32.0–48.0)
pCO2 arterial: 39.6 mmHg (ref 32.0–48.0)
pCO2 arterial: 38.7 mmHg (ref 32.0–48.0)
pCO2 arterial: 41.2 mmHg (ref 32.0–48.0)
pH, Arterial: 7.299 — ABNORMAL LOW (ref 7.350–7.450)
pH, Arterial: 7.313 — ABNORMAL LOW (ref 7.350–7.450)
pH, Arterial: 7.321 — ABNORMAL LOW (ref 7.350–7.450)
pH, Arterial: 7.298 — ABNORMAL LOW (ref 7.350–7.450)
pH, Arterial: 7.345 — ABNORMAL LOW (ref 7.350–7.450)
pO2, Arterial: 409 mmHg — ABNORMAL HIGH (ref 83.0–108.0)
pO2, Arterial: 348 mmHg — ABNORMAL HIGH (ref 83.0–108.0)
pO2, Arterial: 126 mmHg — ABNORMAL HIGH (ref 83.0–108.0)
pO2, Arterial: 125 mmHg — ABNORMAL HIGH (ref 83.0–108.0)

## 2018-06-30 LAB — COOXEMETRY PANEL
Carboxyhemoglobin: 1.2 % (ref 0.5–1.5)
Methemoglobin: 1.9 % — ABNORMAL HIGH (ref 0.0–1.5)
O2 Saturation: 49.2 %
Total hemoglobin: 6.4 g/dL — CL (ref 12.0–16.0)

## 2018-06-30 LAB — CBC
HCT: 43.8 % (ref 39.0–52.0)
HCT: 24.3 % — ABNORMAL LOW (ref 39.0–52.0)
HCT: 20.7 % — ABNORMAL LOW (ref 39.0–52.0)
Hemoglobin: 13.5 g/dL (ref 13.0–17.0)
Hemoglobin: 7.4 g/dL — ABNORMAL LOW (ref 13.0–17.0)
Hemoglobin: 6.3 g/dL — CL (ref 13.0–17.0)
MCH: 22 pg — ABNORMAL LOW (ref 26.0–34.0)
MCH: 22.3 pg — ABNORMAL LOW (ref 26.0–34.0)
MCH: 22.6 pg — ABNORMAL LOW (ref 26.0–34.0)
MCHC: 30.8 g/dL (ref 30.0–36.0)
MCHC: 30.5 g/dL (ref 30.0–36.0)
MCHC: 30.4 g/dL (ref 30.0–36.0)
MCV: 71.5 fL — ABNORMAL LOW (ref 80.0–100.0)
MCV: 73.2 fL — ABNORMAL LOW (ref 80.0–100.0)
MCV: 74.2 fL — ABNORMAL LOW (ref 80.0–100.0)
Platelets: 172 10*3/uL (ref 150–400)
Platelets: 144 10*3/uL — ABNORMAL LOW (ref 150–400)
Platelets: 157 10*3/uL (ref 150–400)
RBC: 6.13 MIL/uL — ABNORMAL HIGH (ref 4.22–5.81)
RBC: 3.32 MIL/uL — ABNORMAL LOW (ref 4.22–5.81)
RBC: 2.79 MIL/uL — ABNORMAL LOW (ref 4.22–5.81)
RDW: 16.5 % — ABNORMAL HIGH (ref 11.5–15.5)
RDW: 15.7 % — ABNORMAL HIGH (ref 11.5–15.5)
RDW: 15.9 % — ABNORMAL HIGH (ref 11.5–15.5)
WBC: 8.8 10*3/uL (ref 4.0–10.5)
WBC: 13.3 10*3/uL — ABNORMAL HIGH (ref 4.0–10.5)
WBC: 10.4 10*3/uL (ref 4.0–10.5)
nRBC: 0 % (ref 0.0–0.2)
nRBC: 0 % (ref 0.0–0.2)
nRBC: 0 % (ref 0.0–0.2)

## 2018-06-30 LAB — POCT I-STAT, CHEM 8
BUN: 18 mg/dL (ref 8–23)
BUN: 13 mg/dL (ref 8–23)
BUN: 15 mg/dL (ref 8–23)
BUN: 16 mg/dL (ref 8–23)
BUN: 16 mg/dL (ref 8–23)
BUN: 16 mg/dL (ref 8–23)
BUN: 15 mg/dL (ref 8–23)
CALCIUM ION: 0.92 mmol/L — AB (ref 1.15–1.40)
CHLORIDE: 104 mmol/L (ref 98–111)
CREATININE: 0.9 mg/dL (ref 0.61–1.24)
CREATININE: 1.1 mg/dL (ref 0.61–1.24)
Calcium, Ion: 1.21 mmol/L (ref 1.15–1.40)
Calcium, Ion: 0.85 mmol/L — CL (ref 1.15–1.40)
Calcium, Ion: 1.15 mmol/L (ref 1.15–1.40)
Calcium, Ion: 0.87 mmol/L — CL (ref 1.15–1.40)
Calcium, Ion: 0.89 mmol/L — CL (ref 1.15–1.40)
Calcium, Ion: 0.97 mmol/L — ABNORMAL LOW (ref 1.15–1.40)
Chloride: 107 mmol/L (ref 98–111)
Chloride: 101 mmol/L (ref 98–111)
Chloride: 109 mmol/L (ref 98–111)
Chloride: 104 mmol/L (ref 98–111)
Chloride: 104 mmol/L (ref 98–111)
Chloride: 105 mmol/L (ref 98–111)
Creatinine, Ser: 1.1 mg/dL (ref 0.61–1.24)
Creatinine, Ser: 0.8 mg/dL (ref 0.61–1.24)
Creatinine, Ser: 1 mg/dL (ref 0.61–1.24)
Creatinine, Ser: 0.9 mg/dL (ref 0.61–1.24)
Creatinine, Ser: 0.9 mg/dL (ref 0.61–1.24)
GLUCOSE: 114 mg/dL — AB (ref 70–99)
Glucose, Bld: 124 mg/dL — ABNORMAL HIGH (ref 70–99)
Glucose, Bld: 149 mg/dL — ABNORMAL HIGH (ref 70–99)
Glucose, Bld: 113 mg/dL — ABNORMAL HIGH (ref 70–99)
Glucose, Bld: 162 mg/dL — ABNORMAL HIGH (ref 70–99)
Glucose, Bld: 165 mg/dL — ABNORMAL HIGH (ref 70–99)
Glucose, Bld: 170 mg/dL — ABNORMAL HIGH (ref 70–99)
HCT: 40 % (ref 39.0–52.0)
HCT: 31 % — ABNORMAL LOW (ref 39.0–52.0)
HCT: 40 % (ref 39.0–52.0)
HCT: 26 % — ABNORMAL LOW (ref 39.0–52.0)
HCT: 25 % — ABNORMAL LOW (ref 39.0–52.0)
HCT: 26 % — ABNORMAL LOW (ref 39.0–52.0)
HCT: 23 % — ABNORMAL LOW (ref 39.0–52.0)
Hemoglobin: 13.6 g/dL (ref 13.0–17.0)
Hemoglobin: 10.5 g/dL — ABNORMAL LOW (ref 13.0–17.0)
Hemoglobin: 13.6 g/dL (ref 13.0–17.0)
Hemoglobin: 8.8 g/dL — ABNORMAL LOW (ref 13.0–17.0)
Hemoglobin: 8.5 g/dL — ABNORMAL LOW (ref 13.0–17.0)
Hemoglobin: 8.8 g/dL — ABNORMAL LOW (ref 13.0–17.0)
Hemoglobin: 7.8 g/dL — ABNORMAL LOW (ref 13.0–17.0)
POTASSIUM: 4.4 mmol/L (ref 3.5–5.1)
Potassium: 4.4 mmol/L (ref 3.5–5.1)
Potassium: 4.5 mmol/L (ref 3.5–5.1)
Potassium: 5.4 mmol/L — ABNORMAL HIGH (ref 3.5–5.1)
Potassium: 4.6 mmol/L (ref 3.5–5.1)
Potassium: 3.8 mmol/L (ref 3.5–5.1)
Potassium: 3.6 mmol/L (ref 3.5–5.1)
Sodium: 138 mmol/L (ref 135–145)
Sodium: 136 mmol/L (ref 135–145)
Sodium: 137 mmol/L (ref 135–145)
Sodium: 136 mmol/L (ref 135–145)
Sodium: 138 mmol/L (ref 135–145)
Sodium: 139 mmol/L (ref 135–145)
Sodium: 143 mmol/L (ref 135–145)
TCO2: 27 mmol/L (ref 22–32)
TCO2: 21 mmol/L — ABNORMAL LOW (ref 22–32)
TCO2: 23 mmol/L (ref 22–32)
TCO2: 26 mmol/L (ref 22–32)
TCO2: 23 mmol/L (ref 22–32)
TCO2: 21 mmol/L — AB (ref 22–32)
TCO2: 24 mmol/L (ref 22–32)

## 2018-06-30 LAB — COMPREHENSIVE METABOLIC PANEL
ALK PHOS: 39 U/L (ref 38–126)
ALT: 24 U/L (ref 0–44)
ANION GAP: 7 (ref 5–15)
AST: 125 U/L — ABNORMAL HIGH (ref 15–41)
Albumin: 3.3 g/dL — ABNORMAL LOW (ref 3.5–5.0)
BUN: 17 mg/dL (ref 8–23)
CALCIUM: 8.5 mg/dL — AB (ref 8.9–10.3)
CO2: 22 mmol/L (ref 22–32)
Chloride: 109 mmol/L (ref 98–111)
Creatinine, Ser: 1.28 mg/dL — ABNORMAL HIGH (ref 0.61–1.24)
GFR calc Af Amer: >60 mL/min (ref >60)
GFR calc non Af Amer: 57 mL/min — ABNORMAL LOW (ref >60)
Glucose, Bld: 115 mg/dL — ABNORMAL HIGH (ref 70–99)
Potassium: 4 mmol/L (ref 3.5–5.1)
Sodium: 138 mmol/L (ref 135–145)
Total Bilirubin: 0.9 mg/dL (ref 0.3–1.2)
Total Protein: 6.2 g/dL — ABNORMAL LOW (ref 6.5–8.1)

## 2018-06-30 LAB — BLOOD GAS, ARTERIAL
Acid-base deficit: 1.3 mmol/L (ref 0.0–2.0)
Bicarbonate: 23.3 mmol/L (ref 20.0–28.0)
Drawn by: 511911
O2 Content: 2 L/min
O2 Saturation: 97.5 %
PCO2 ART: 41.5 mmHg (ref 32.0–48.0)
PH ART: 7.368 (ref 7.350–7.450)
Patient temperature: 98.6
pO2, Arterial: 101 mmHg (ref 83.0–108.0)

## 2018-06-30 LAB — LIPID PANEL
Cholesterol: 237 mg/dL — ABNORMAL HIGH (ref 0–200)
HDL: 31 mg/dL — ABNORMAL LOW (ref >40)
LDL Cholesterol: 178 mg/dL — ABNORMAL HIGH (ref 0–99)
Total CHOL/HDL Ratio: 7.6 RATIO
Triglycerides: 139 mg/dL (ref <150)
VLDL: 28 mg/dL (ref 0–40)

## 2018-06-30 LAB — PROTIME-INR
INR: 2.02
Prothrombin Time: 22.6 seconds — ABNORMAL HIGH (ref 11.4–15.2)

## 2018-06-30 LAB — CREATININE, SERUM
CREATININE: 1.45 mg/dL — AB (ref 0.61–1.24)
GFR calc Af Amer: 57 mL/min — ABNORMAL LOW (ref >60)
GFR calc non Af Amer: 49 mL/min — ABNORMAL LOW (ref >60)

## 2018-06-30 LAB — URINALYSIS, COMPLETE (UACMP) WITH MICROSCOPIC
Bacteria, UA: NONE SEEN
Bilirubin Urine: NEGATIVE
GLUCOSE, UA: NEGATIVE mg/dL
Hgb urine dipstick: NEGATIVE
Ketones, ur: NEGATIVE mg/dL
LEUKOCYTES UA: NEGATIVE
Nitrite: NEGATIVE
Protein, ur: NEGATIVE mg/dL
Specific Gravity, Urine: 1.033 — ABNORMAL HIGH (ref 1.005–1.030)
pH: 5 (ref 5.0–8.0)

## 2018-06-30 LAB — PREPARE RBC (CROSSMATCH)

## 2018-06-30 LAB — TROPONIN I: Troponin I: 41.24 ng/mL (ref <0.03)

## 2018-06-30 LAB — HEMOGLOBIN AND HEMATOCRIT, BLOOD
HCT: 26.9 % — ABNORMAL LOW (ref 39.0–52.0)
HCT: 24.7 % — ABNORMAL LOW (ref 39.0–52.0)
Hemoglobin: 8.2 g/dL — ABNORMAL LOW (ref 13.0–17.0)
Hemoglobin: 7.6 g/dL — ABNORMAL LOW (ref 13.0–17.0)

## 2018-06-30 LAB — TSH: TSH: 4.116 u[IU]/mL (ref 0.350–4.500)

## 2018-06-30 LAB — POCT I-STAT 4, (NA,K, GLUC, HGB,HCT)
Glucose, Bld: 156 mg/dL — ABNORMAL HIGH (ref 70–99)
HCT: 26 % — ABNORMAL LOW (ref 39.0–52.0)
Hemoglobin: 8.8 g/dL — ABNORMAL LOW (ref 13.0–17.0)
Potassium: 4 mmol/L (ref 3.5–5.1)
Sodium: 142 mmol/L (ref 135–145)

## 2018-06-30 LAB — GLUCOSE, CAPILLARY
Glucose-Capillary: 162 mg/dL — ABNORMAL HIGH (ref 70–99)
Glucose-Capillary: 168 mg/dL — ABNORMAL HIGH (ref 70–99)
Glucose-Capillary: 154 mg/dL — ABNORMAL HIGH (ref 70–99)
Glucose-Capillary: 165 mg/dL — ABNORMAL HIGH (ref 70–99)
Glucose-Capillary: 148 mg/dL — ABNORMAL HIGH (ref 70–99)
Glucose-Capillary: 154 mg/dL — ABNORMAL HIGH (ref 70–99)
Glucose-Capillary: 189 mg/dL — ABNORMAL HIGH (ref 70–99)
Glucose-Capillary: 173 mg/dL — ABNORMAL HIGH (ref 70–99)

## 2018-06-30 LAB — MAGNESIUM: Magnesium: 3.4 mg/dL — ABNORMAL HIGH (ref 1.7–2.4)

## 2018-06-30 LAB — PLATELET COUNT: Platelets: 97 10*3/uL — ABNORMAL LOW (ref 150–400)

## 2018-06-30 LAB — HIV ANTIBODY (ROUTINE TESTING W REFLEX): HIV Screen 4th Generation wRfx: NONREACTIVE

## 2018-06-30 LAB — APTT: aPTT: 42 seconds — ABNORMAL HIGH (ref 24–36)

## 2018-06-30 SURGERY — CORONARY ARTERY BYPASS GRAFTING (CABG)
Anesthesia: General | Site: Leg Lower | Laterality: Right

## 2018-06-30 MED ORDER — HEMOSTATIC AGENTS (NO CHARGE) OPTIME
TOPICAL | Status: DC | PRN
  Administered 2018-06-30: 3 via TOPICAL
  Administered 2018-06-30: 1 via TOPICAL

## 2018-06-30 MED ORDER — SODIUM BICARBONATE 8.4 % IV SOLN
100.0000 meq | Freq: Once | INTRAVENOUS | Status: AC
  Administered 2018-06-30: 100 meq via INTRAVENOUS

## 2018-06-30 MED ORDER — SODIUM CHLORIDE 0.9 % IV SOLN
INTRAVENOUS | Status: DC | PRN
  Administered 2018-06-30: 1 [IU]/h via INTRAVENOUS

## 2018-06-30 MED ORDER — ASPIRIN 81 MG PO CHEW: 324.0000 mg | CHEWABLE_TABLET | Freq: Every day | ORAL | Status: DC

## 2018-06-30 MED ORDER — SODIUM CHLORIDE 0.45 % IV SOLN: INTRAVENOUS | Status: DC | PRN

## 2018-06-30 MED ORDER — MILRINONE LACTATE IN DEXTROSE 20-5 MG/100ML-% IV SOLN
0.3000 ug/kg/min | INTRAVENOUS | Status: DC
  Administered 2018-06-30: 0.3 ug/kg/min via INTRAVENOUS
  Administered 2018-07-01 – 2018-07-02 (×2): 0.25 ug/kg/min via INTRAVENOUS
  Filled 2018-06-30 (×5): qty 100

## 2018-06-30 MED ORDER — ARTIFICIAL TEARS OPHTHALMIC OINT
TOPICAL_OINTMENT | OPHTHALMIC | Status: DC | PRN
  Administered 2018-06-30: 1 via OPHTHALMIC

## 2018-06-30 MED ORDER — ALBUMIN HUMAN 5 % IV SOLN
250.0000 mL | INTRAVENOUS | Status: AC | PRN
  Administered 2018-06-30 (×3): 12.5 g via INTRAVENOUS
  Filled 2018-06-30 (×2): qty 250

## 2018-06-30 MED ORDER — ONDANSETRON HCL 4 MG/2ML IJ SOLN
INTRAMUSCULAR | Status: DC | PRN
  Administered 2018-06-30: 4 mg via INTRAVENOUS

## 2018-06-30 MED ORDER — TRANEXAMIC ACID 1000 MG/10ML IV SOLN
INTRAVENOUS | Status: DC | PRN
  Administered 2018-06-30: 1.5 mg/kg/h via INTRAVENOUS

## 2018-06-30 MED ORDER — ACETAMINOPHEN 160 MG/5ML PO SOLN: 650.0000 mg | Freq: Once | ORAL | Status: AC

## 2018-06-30 MED ORDER — DEXMEDETOMIDINE HCL IN NACL 200 MCG/50ML IV SOLN: 0.0000 ug/kg/h | INTRAVENOUS | Status: DC

## 2018-06-30 MED ORDER — EPHEDRINE SULFATE-NACL 50-0.9 MG/10ML-% IV SOSY
PREFILLED_SYRINGE | INTRAVENOUS | Status: DC | PRN
  Administered 2018-06-30: 10 mg via INTRAVENOUS
  Administered 2018-06-30 (×2): 5 mg via INTRAVENOUS
  Administered 2018-06-30 (×2): 10 mg via INTRAVENOUS

## 2018-06-30 MED ORDER — LACTATED RINGERS IV SOLN: INTRAVENOUS | Status: DC

## 2018-06-30 MED ORDER — ROCURONIUM BROMIDE 50 MG/5ML IV SOSY
PREFILLED_SYRINGE | INTRAVENOUS | Status: AC
  Filled 2018-06-30: qty 15

## 2018-06-30 MED ORDER — BISACODYL 10 MG RE SUPP
10.0000 mg | Freq: Every day | RECTAL | Status: DC
  Administered 2018-07-01: 10 mg via RECTAL
  Filled 2018-06-30: qty 1

## 2018-06-30 MED ORDER — SODIUM BICARBONATE 8.4 % IV SOLN
50.0000 meq | Freq: Once | INTRAVENOUS | Status: AC
  Administered 2018-06-30: 50 meq via INTRAVENOUS

## 2018-06-30 MED ORDER — SODIUM BICARBONATE 8.4 % IV SOLN
INTRAVENOUS | Status: AC
  Filled 2018-06-30: qty 50

## 2018-06-30 MED ORDER — ONDANSETRON HCL 4 MG/2ML IJ SOLN: 4.0000 mg | Freq: Four times a day (QID) | INTRAMUSCULAR | Status: DC | PRN

## 2018-06-30 MED ORDER — POTASSIUM CHLORIDE 10 MEQ/50ML IV SOLN
10.0000 meq | INTRAVENOUS | Status: AC
  Administered 2018-06-30 (×2): 10 meq via INTRAVENOUS

## 2018-06-30 MED ORDER — VANCOMYCIN HCL IN DEXTROSE 1-5 GM/200ML-% IV SOLN
1000.0000 mg | Freq: Once | INTRAVENOUS | Status: AC
  Administered 2018-06-30: 1000 mg via INTRAVENOUS
  Filled 2018-06-30: qty 200

## 2018-06-30 MED ORDER — HEPARIN SODIUM (PORCINE) 1000 UNIT/ML IJ SOLN
INTRAMUSCULAR | Status: DC | PRN
  Administered 2018-06-30: 2000 [IU] via INTRAVENOUS
  Administered 2018-06-30: 22000 [IU] via INTRAVENOUS

## 2018-06-30 MED ORDER — ROCURONIUM BROMIDE 10 MG/ML (PF) SYRINGE
PREFILLED_SYRINGE | INTRAVENOUS | Status: DC | PRN
  Administered 2018-06-30: 20 mg via INTRAVENOUS
  Administered 2018-06-30 (×4): 50 mg via INTRAVENOUS

## 2018-06-30 MED ORDER — NOREPINEPHRINE BITARTRATE 1 MG/ML IV SOLN
0.0000 ug/min | INTRAVENOUS | Status: DC
  Administered 2018-06-30: 3 ug/min via INTRAVENOUS
  Filled 2018-06-30: qty 4

## 2018-06-30 MED ORDER — SODIUM CHLORIDE 0.9% IV SOLUTION: Freq: Once | INTRAVENOUS | Status: DC

## 2018-06-30 MED ORDER — VANCOMYCIN HCL 1000 MG IV SOLR: INTRAVENOUS | Status: DC | PRN

## 2018-06-30 MED ORDER — SUCCINYLCHOLINE CHLORIDE 200 MG/10ML IV SOSY
PREFILLED_SYRINGE | INTRAVENOUS | Status: AC
  Filled 2018-06-30: qty 10

## 2018-06-30 MED ORDER — METOPROLOL TARTRATE 5 MG/5ML IV SOLN: 2.5000 mg | INTRAVENOUS | Status: DC | PRN

## 2018-06-30 MED ORDER — MAGNESIUM SULFATE 4 GM/100ML IV SOLN
4.0000 g | Freq: Once | INTRAVENOUS | Status: AC
  Administered 2018-06-30: 4 g via INTRAVENOUS
  Filled 2018-06-30: qty 100

## 2018-06-30 MED ORDER — ROCURONIUM BROMIDE 50 MG/5ML IV SOSY
PREFILLED_SYRINGE | INTRAVENOUS | Status: AC
  Filled 2018-06-30: qty 5

## 2018-06-30 MED ORDER — FAMOTIDINE IN NACL 20-0.9 MG/50ML-% IV SOLN
20.0000 mg | Freq: Two times a day (BID) | INTRAVENOUS | Status: AC
  Administered 2018-07-01: 20 mg via INTRAVENOUS
  Filled 2018-06-30: qty 50

## 2018-06-30 MED ORDER — ALBUMIN HUMAN 5 % IV SOLN
INTRAVENOUS | Status: DC | PRN
  Administered 2018-06-30 (×4): via INTRAVENOUS

## 2018-06-30 MED ORDER — MIDAZOLAM HCL (PF) 10 MG/2ML IJ SOLN
INTRAMUSCULAR | Status: AC
  Filled 2018-06-30: qty 2

## 2018-06-30 MED ORDER — MIDAZOLAM HCL 5 MG/5ML IJ SOLN
INTRAMUSCULAR | Status: DC | PRN
  Administered 2018-06-30: 0.5 mg via INTRAVENOUS
  Administered 2018-06-30: 1 mg via INTRAVENOUS
  Administered 2018-06-30: 2 mg via INTRAVENOUS
  Administered 2018-06-30: .5 mg via INTRAVENOUS
  Administered 2018-06-30: 1 mg via INTRAVENOUS
  Administered 2018-06-30: 2 mg via INTRAVENOUS

## 2018-06-30 MED ORDER — FENTANYL CITRATE (PF) 250 MCG/5ML IJ SOLN
INTRAMUSCULAR | Status: AC
  Filled 2018-06-30: qty 25

## 2018-06-30 MED ORDER — ASPIRIN EC 325 MG PO TBEC: 325.0000 mg | DELAYED_RELEASE_TABLET | Freq: Every day | ORAL | Status: DC

## 2018-06-30 MED ORDER — PHENYLEPHRINE 40 MCG/ML (10ML) SYRINGE FOR IV PUSH (FOR BLOOD PRESSURE SUPPORT)
PREFILLED_SYRINGE | INTRAVENOUS | Status: AC
  Filled 2018-06-30: qty 20

## 2018-06-30 MED ORDER — MORPHINE SULFATE (PF) 2 MG/ML IV SOLN
1.0000 mg | INTRAVENOUS | Status: DC | PRN
  Administered 2018-07-01 (×5): 2 mg via INTRAVENOUS
  Administered 2018-07-01 – 2018-07-02 (×2): 4 mg via INTRAVENOUS
  Administered 2018-07-02: 2 mg via INTRAVENOUS
  Filled 2018-06-30: qty 1
  Filled 2018-06-30 (×2): qty 2
  Filled 2018-06-30 (×5): qty 1

## 2018-06-30 MED ORDER — PROPOFOL 10 MG/ML IV BOLUS
INTRAVENOUS | Status: AC
  Filled 2018-06-30: qty 20

## 2018-06-30 MED ORDER — EPINEPHRINE PF 1 MG/ML IJ SOLN
0.0000 ug/min | INTRAVENOUS | Status: DC
  Administered 2018-07-02 (×3): 6 ug/min via INTRAVENOUS
  Administered 2018-07-03 (×2): 30 ug/min via INTRAVENOUS
  Filled 2018-06-30 (×9): qty 4

## 2018-06-30 MED ORDER — ACETAMINOPHEN 650 MG RE SUPP
650.0000 mg | Freq: Once | RECTAL | Status: AC
  Administered 2018-06-30: 650 mg via RECTAL

## 2018-06-30 MED ORDER — NOREPINEPHRINE BITARTRATE 1 MG/ML IV SOLN
0.0000 ug/min | INTRAVENOUS | Status: DC
  Administered 2018-07-01: 4 ug/min via INTRAVENOUS
  Filled 2018-06-30: qty 4

## 2018-06-30 MED ORDER — SODIUM CHLORIDE 0.9% FLUSH
3.0000 mL | Freq: Two times a day (BID) | INTRAVENOUS | Status: DC
  Administered 2018-07-01 (×2): 3 mL via INTRAVENOUS

## 2018-06-30 MED ORDER — MIDAZOLAM HCL 2 MG/2ML IJ SOLN: 2.0000 mg | INTRAMUSCULAR | Status: DC | PRN

## 2018-06-30 MED ORDER — SODIUM CHLORIDE 0.9% FLUSH
3.0000 mL | INTRAVENOUS | Status: DC | PRN
  Administered 2018-07-02: 3 mL via INTRAVENOUS
  Filled 2018-06-30: qty 3

## 2018-06-30 MED ORDER — PHENYLEPHRINE HCL-NACL 20-0.9 MG/250ML-% IV SOLN
0.0000 ug/min | INTRAVENOUS | Status: DC
  Administered 2018-07-01: 100 ug/min via INTRAVENOUS
  Administered 2018-07-01: 40 ug/min via INTRAVENOUS
  Administered 2018-07-02 (×2): 100 ug/min via INTRAVENOUS
  Filled 2018-06-30 (×6): qty 250

## 2018-06-30 MED ORDER — OXYCODONE HCL 5 MG PO TABS: 5.0000 mg | ORAL_TABLET | ORAL | Status: DC | PRN

## 2018-06-30 MED ORDER — INSULIN REGULAR BOLUS VIA INFUSION
0.0000 [IU] | Freq: Three times a day (TID) | INTRAVENOUS | Status: DC
  Filled 2018-06-30: qty 10

## 2018-06-30 MED ORDER — METOPROLOL TARTRATE 25 MG/10 ML ORAL SUSPENSION: 12.5000 mg | Freq: Two times a day (BID) | ORAL | Status: DC

## 2018-06-30 MED ORDER — DOPAMINE-DEXTROSE 3.2-5 MG/ML-% IV SOLN: 3.0000 ug/kg/min | INTRAVENOUS | Status: DC

## 2018-06-30 MED ORDER — PROPOFOL 10 MG/ML IV BOLUS
INTRAVENOUS | Status: DC | PRN
  Administered 2018-06-30: 30 mg via INTRAVENOUS

## 2018-06-30 MED ORDER — DOCUSATE SODIUM 100 MG PO CAPS: 200.0000 mg | ORAL_CAPSULE | Freq: Every day | ORAL | Status: DC

## 2018-06-30 MED ORDER — PANTOPRAZOLE SODIUM 40 MG PO TBEC: 40.0000 mg | DELAYED_RELEASE_TABLET | Freq: Every day | ORAL | Status: DC

## 2018-06-30 MED ORDER — LACTATED RINGERS IV SOLN
INTRAVENOUS | Status: DC | PRN
  Administered 2018-06-30: 07:00:00 via INTRAVENOUS

## 2018-06-30 MED ORDER — SODIUM CHLORIDE (PF) 0.9 % IJ SOLN
INTRAMUSCULAR | Status: AC
  Filled 2018-06-30: qty 10

## 2018-06-30 MED ORDER — INSULIN REGULAR(HUMAN) IN NACL 100-0.9 UT/100ML-% IV SOLN
INTRAVENOUS | Status: DC
  Administered 2018-06-30: 12.4 [IU]/h via INTRAVENOUS
  Administered 2018-07-01: 3.2 [IU]/h via INTRAVENOUS
  Filled 2018-06-30 (×2): qty 100

## 2018-06-30 MED ORDER — SODIUM CHLORIDE 0.9 % IV SOLN
INTRAVENOUS | Status: DC
  Administered 2018-06-30: 14:00:00 via INTRAVENOUS

## 2018-06-30 MED ORDER — SODIUM CHLORIDE 0.9 % IV SOLN
INTRAVENOUS | Status: DC | PRN
  Administered 2018-06-30: 65 ug/min via INTRAVENOUS
  Administered 2018-06-30: 50 ug/min via INTRAVENOUS

## 2018-06-30 MED ORDER — LEVOFLOXACIN IN D5W 750 MG/150ML IV SOLN
750.0000 mg | INTRAVENOUS | Status: AC
  Administered 2018-07-01: 750 mg via INTRAVENOUS
  Filled 2018-06-30: qty 150

## 2018-06-30 MED ORDER — SODIUM CHLORIDE 0.9 % IV SOLN
250.0000 mL | INTRAVENOUS | Status: DC
  Administered 2018-07-01: 250 mL via INTRAVENOUS

## 2018-06-30 MED ORDER — FENTANYL CITRATE (PF) 250 MCG/5ML IJ SOLN
INTRAMUSCULAR | Status: DC | PRN
  Administered 2018-06-30: 150 ug via INTRAVENOUS
  Administered 2018-06-30: 350 ug via INTRAVENOUS
  Administered 2018-06-30: 100 ug via INTRAVENOUS
  Administered 2018-06-30: 200 ug via INTRAVENOUS
  Administered 2018-06-30 (×2): 25 ug via INTRAVENOUS
  Administered 2018-06-30: 150 ug via INTRAVENOUS

## 2018-06-30 MED ORDER — PROTAMINE SULFATE 10 MG/ML IV SOLN
INTRAVENOUS | Status: DC | PRN
  Administered 2018-06-30: 220 mg via INTRAVENOUS
  Administered 2018-06-30: 20 mg via INTRAVENOUS
  Administered 2018-06-30: 25 mg via INTRAVENOUS

## 2018-06-30 MED ORDER — ACETAMINOPHEN 160 MG/5ML PO SOLN: 1000.0000 mg | Freq: Four times a day (QID) | ORAL | Status: DC

## 2018-06-30 MED ORDER — LACTATED RINGERS IV SOLN
INTRAVENOUS | Status: DC | PRN
  Administered 2018-06-30 (×2): via INTRAVENOUS

## 2018-06-30 MED ORDER — EPINEPHRINE PF 1 MG/10ML IJ SOSY
PREFILLED_SYRINGE | INTRAMUSCULAR | Status: AC
  Filled 2018-06-30: qty 10

## 2018-06-30 MED ORDER — ARTIFICIAL TEARS OPHTHALMIC OINT
TOPICAL_OINTMENT | OPHTHALMIC | Status: AC
  Filled 2018-06-30: qty 3.5

## 2018-06-30 MED ORDER — SUCCINYLCHOLINE CHLORIDE 200 MG/10ML IV SOSY
PREFILLED_SYRINGE | INTRAVENOUS | Status: DC | PRN
  Administered 2018-06-30: 120 mg via INTRAVENOUS

## 2018-06-30 MED ORDER — EPHEDRINE 5 MG/ML INJ
INTRAVENOUS | Status: AC
  Filled 2018-06-30: qty 10

## 2018-06-30 MED ORDER — EPINEPHRINE PF 1 MG/10ML IJ SOSY
PREFILLED_SYRINGE | INTRAMUSCULAR | Status: DC | PRN
  Administered 2018-06-30 (×2): 25 ug via INTRAVENOUS
  Administered 2018-06-30: 50 ug via INTRAVENOUS
  Administered 2018-06-30: 25 ug via INTRAVENOUS
  Administered 2018-06-30: 50 ug via INTRAVENOUS
  Administered 2018-06-30: 25 ug via INTRAVENOUS

## 2018-06-30 MED ORDER — SODIUM CHLORIDE 0.9% IV SOLUTION
Freq: Once | INTRAVENOUS | Status: AC
  Administered 2018-06-30: 21:00:00 via INTRAVENOUS

## 2018-06-30 MED ORDER — PHENYLEPHRINE 40 MCG/ML (10ML) SYRINGE FOR IV PUSH (FOR BLOOD PRESSURE SUPPORT)
PREFILLED_SYRINGE | INTRAVENOUS | Status: AC
  Filled 2018-06-30: qty 10

## 2018-06-30 MED ORDER — DEXMEDETOMIDINE HCL IN NACL 400 MCG/100ML IV SOLN
INTRAVENOUS | Status: DC | PRN
  Administered 2018-06-30: 0.7 ug/kg/h via INTRAVENOUS

## 2018-06-30 MED ORDER — NITROGLYCERIN IN D5W 200-5 MCG/ML-% IV SOLN: 0.0000 ug/min | INTRAVENOUS | Status: DC

## 2018-06-30 MED ORDER — PHENYLEPHRINE 40 MCG/ML (10ML) SYRINGE FOR IV PUSH (FOR BLOOD PRESSURE SUPPORT)
PREFILLED_SYRINGE | INTRAVENOUS | Status: DC | PRN
  Administered 2018-06-30: 80 ug via INTRAVENOUS
  Administered 2018-06-30: 120 ug via INTRAVENOUS
  Administered 2018-06-30: 80 ug via INTRAVENOUS
  Administered 2018-06-30: 40 ug via INTRAVENOUS
  Administered 2018-06-30: 120 ug via INTRAVENOUS

## 2018-06-30 MED ORDER — LACTATED RINGERS IV SOLN: 500.0000 mL | Freq: Once | INTRAVENOUS | Status: DC | PRN

## 2018-06-30 MED ORDER — CHLORHEXIDINE GLUCONATE 0.12 % MT SOLN
15.0000 mL | OROMUCOSAL | Status: AC
  Administered 2018-06-30: 15 mL via OROMUCOSAL

## 2018-06-30 MED ORDER — ACETAMINOPHEN 500 MG PO TABS
1000.0000 mg | ORAL_TABLET | Freq: Four times a day (QID) | ORAL | Status: DC
  Administered 2018-07-01 (×2): 1000 mg via ORAL
  Filled 2018-06-30 (×2): qty 2

## 2018-06-30 MED ORDER — BISACODYL 5 MG PO TBEC: 10.0000 mg | DELAYED_RELEASE_TABLET | Freq: Every day | ORAL | Status: DC

## 2018-06-30 MED ORDER — TRAMADOL HCL 50 MG PO TABS
50.0000 mg | ORAL_TABLET | ORAL | Status: DC | PRN
  Administered 2018-07-01: 100 mg via ORAL
  Filled 2018-06-30: qty 2

## 2018-06-30 MED ORDER — METOPROLOL TARTRATE 12.5 MG HALF TABLET: 12.5000 mg | ORAL_TABLET | Freq: Two times a day (BID) | ORAL | Status: DC

## 2018-06-30 MED ORDER — 0.9 % SODIUM CHLORIDE (POUR BTL) OPTIME
TOPICAL | Status: DC | PRN
  Administered 2018-06-30: 5000 mL

## 2018-06-30 SURGICAL SUPPLY — 93 items
ADAPTER CARDIO PERF ANTE/RETRO (ADAPTER) ×2 IMPLANT
BAG DECANTER FOR FLEXI CONT (MISCELLANEOUS) ×5 IMPLANT
BANDAGE ACE 4X5 VEL STRL LF (GAUZE/BANDAGES/DRESSINGS) ×5 IMPLANT
BANDAGE ACE 6X5 VEL STRL LF (GAUZE/BANDAGES/DRESSINGS) ×5 IMPLANT
BANDAGE ELASTIC 4 VELCRO ST LF (GAUZE/BANDAGES/DRESSINGS) ×2 IMPLANT
BANDAGE ELASTIC 6 VELCRO ST LF (GAUZE/BANDAGES/DRESSINGS) ×2 IMPLANT
BASKET HEART  (ORDER IN 25'S) (MISCELLANEOUS) ×1
BASKET HEART (ORDER IN 25'S) (MISCELLANEOUS) ×1
BASKET HEART (ORDER IN 25S) (MISCELLANEOUS) ×3 IMPLANT
BLADE STERNUM SYSTEM 6 (BLADE) ×5 IMPLANT
BLADE SURG 11 STRL SS (BLADE) ×4 IMPLANT
BNDG GAUZE ELAST 4 BULKY (GAUZE/BANDAGES/DRESSINGS) ×5 IMPLANT
CANISTER SUCT 3000ML PPV (MISCELLANEOUS) ×5 IMPLANT
CANNULA EZ GLIDE AORTIC 21FR (CANNULA) ×5 IMPLANT
CANNULA GUNDRY RCSP 15FR (MISCELLANEOUS) ×2 IMPLANT
CATH CPB KIT HENDRICKSON (MISCELLANEOUS) ×5 IMPLANT
CATH ROBINSON RED A/P 18FR (CATHETERS) ×5 IMPLANT
CATH THORACIC 36FR (CATHETERS) ×5 IMPLANT
CATH THORACIC 36FR RT ANG (CATHETERS) ×5 IMPLANT
CLIP FOGARTY SPRING 6M (CLIP) ×2 IMPLANT
CLIP VESOCCLUDE MED 24/CT (CLIP) IMPLANT
CLIP VESOCCLUDE SM WIDE 24/CT (CLIP) ×4 IMPLANT
COVER WAND RF STERILE (DRAPES) ×5 IMPLANT
CRADLE DONUT ADULT HEAD (MISCELLANEOUS) ×5 IMPLANT
DERMABOND ADVANCED (GAUZE/BANDAGES/DRESSINGS) ×2
DERMABOND ADVANCED .7 DNX12 (GAUZE/BANDAGES/DRESSINGS) IMPLANT
DRAPE CARDIOVASCULAR INCISE (DRAPES) ×2
DRAPE SLUSH/WARMER DISC (DRAPES) ×5 IMPLANT
DRAPE SRG 135X102X78XABS (DRAPES) ×3 IMPLANT
DRSG AQUACEL AG ADV 3.5X14 (GAUZE/BANDAGES/DRESSINGS) ×2 IMPLANT
DRSG COVADERM 4X14 (GAUZE/BANDAGES/DRESSINGS) ×5 IMPLANT
ELECT REM PT RETURN 9FT ADLT (ELECTROSURGICAL) ×10
ELECTRODE REM PT RTRN 9FT ADLT (ELECTROSURGICAL) ×6 IMPLANT
FELT TEFLON 1X6 (MISCELLANEOUS) ×10 IMPLANT
GAUZE SPONGE 4X4 12PLY STRL (GAUZE/BANDAGES/DRESSINGS) ×10 IMPLANT
GAUZE SPONGE 4X4 12PLY STRL LF (GAUZE/BANDAGES/DRESSINGS) ×2 IMPLANT
GLOVE SURG SIGNA 7.5 PF LTX (GLOVE) ×15 IMPLANT
GOWN STRL REUS W/ TWL LRG LVL3 (GOWN DISPOSABLE) ×12 IMPLANT
GOWN STRL REUS W/ TWL XL LVL3 (GOWN DISPOSABLE) ×6 IMPLANT
GOWN STRL REUS W/TWL LRG LVL3 (GOWN DISPOSABLE) ×8
GOWN STRL REUS W/TWL XL LVL3 (GOWN DISPOSABLE) ×4
HEMOSTAT POWDER SURGIFOAM 1G (HEMOSTASIS) ×15 IMPLANT
HEMOSTAT SURGICEL 2X14 (HEMOSTASIS) ×5 IMPLANT
INSERT FOGARTY XLG (MISCELLANEOUS) IMPLANT
KIT BASIN OR (CUSTOM PROCEDURE TRAY) ×5 IMPLANT
KIT SUCTION CATH 14FR (SUCTIONS) ×10 IMPLANT
KIT TURNOVER KIT B (KITS) ×5 IMPLANT
KIT VASOVIEW HEMOPRO 2 VH 4000 (KITS) ×5 IMPLANT
MARKER GRAFT CORONARY BYPASS (MISCELLANEOUS) ×15 IMPLANT
NS IRRIG 1000ML POUR BTL (IV SOLUTION) ×25 IMPLANT
PACK E OPEN HEART (SUTURE) ×5 IMPLANT
PACK OPEN HEART (CUSTOM PROCEDURE TRAY) ×5 IMPLANT
PAD ARMBOARD 7.5X6 YLW CONV (MISCELLANEOUS) ×10 IMPLANT
PAD ELECT DEFIB RADIOL ZOLL (MISCELLANEOUS) ×5 IMPLANT
PENCIL BUTTON HOLSTER BLD 10FT (ELECTRODE) ×5 IMPLANT
PUNCH AORTIC ROTATE 4.0MM (MISCELLANEOUS) IMPLANT
PUNCH AORTIC ROTATE 4.5MM 8IN (MISCELLANEOUS) IMPLANT
PUNCH AORTIC ROTATE 5MM 8IN (MISCELLANEOUS) IMPLANT
SET CARDIOPLEGIA MPS 5001102 (MISCELLANEOUS) ×2 IMPLANT
SOLUTION ANTI FOG 6CC (MISCELLANEOUS) ×2 IMPLANT
SPONGE LAP 4X18 RFD (DISPOSABLE) ×2 IMPLANT
SUT BONE WAX W31G (SUTURE) ×5 IMPLANT
SUT MNCRL AB 4-0 PS2 18 (SUTURE) ×2 IMPLANT
SUT PROLENE 3 0 SH DA (SUTURE) ×5 IMPLANT
SUT PROLENE 4 0 RB 1 (SUTURE) ×6
SUT PROLENE 4 0 SH DA (SUTURE) IMPLANT
SUT PROLENE 4-0 RB1 .5 CRCL 36 (SUTURE) IMPLANT
SUT PROLENE 6 0 C 1 30 (SUTURE) ×14 IMPLANT
SUT PROLENE 7 0 BV 1 (SUTURE) ×6 IMPLANT
SUT PROLENE 7 0 BV1 MDA (SUTURE) ×7 IMPLANT
SUT PROLENE 8 0 BV175 6 (SUTURE) IMPLANT
SUT SILK  1 MH (SUTURE) ×2
SUT SILK 1 MH (SUTURE) IMPLANT
SUT STEEL 6MS V (SUTURE) ×5 IMPLANT
SUT STEEL STERNAL CCS#1 18IN (SUTURE) IMPLANT
SUT STEEL SZ 6 DBL 3X14 BALL (SUTURE) ×5 IMPLANT
SUT VIC AB 1 CTX 36 (SUTURE) ×4
SUT VIC AB 1 CTX36XBRD ANBCTR (SUTURE) ×6 IMPLANT
SUT VIC AB 2-0 CT1 27 (SUTURE) ×2
SUT VIC AB 2-0 CT1 TAPERPNT 27 (SUTURE) IMPLANT
SUT VIC AB 2-0 CTX 27 (SUTURE) IMPLANT
SUT VIC AB 3-0 SH 27 (SUTURE)
SUT VIC AB 3-0 SH 27X BRD (SUTURE) IMPLANT
SUT VIC AB 3-0 X1 27 (SUTURE) IMPLANT
SUT VICRYL 4-0 PS2 18IN ABS (SUTURE) IMPLANT
SYSTEM SAHARA CHEST DRAIN ATS (WOUND CARE) ×5 IMPLANT
TAPE CLOTH SURG 4X10 WHT LF (GAUZE/BANDAGES/DRESSINGS) ×2 IMPLANT
TOWEL GREEN STERILE (TOWEL DISPOSABLE) ×5 IMPLANT
TOWEL GREEN STERILE FF (TOWEL DISPOSABLE) ×5 IMPLANT
TRAY FOLEY SLVR 16FR TEMP STAT (SET/KITS/TRAYS/PACK) ×5 IMPLANT
TUBING INSUFFLATION (TUBING) ×5 IMPLANT
UNDERPAD 30X30 (UNDERPADS AND DIAPERS) ×5 IMPLANT
WATER STERILE IRR 1000ML POUR (IV SOLUTION) ×10 IMPLANT

## 2018-06-30 NOTE — Progress Notes (Signed)
EVENING ROUNDS NOTE :     [address]       [place], [id number]             [phone]                 Day of Surgery Procedure(s) (LRB): CORONARY ARTERY BYPASS GRAFTING (CABG) (N/A) TRANSESOPHAGEAL ECHOCARDIOGRAM (TEE) (N/A) ENDOVEIN HARVEST OF GREATER SAPHENOUS VEIN (Right)  Total Length of Stay:  LOS: 1 day  BP 108/79   Pulse (!) 122   Temp 98.4 F (36.9 C)   Resp 12   Ht 5\' 2"  (1.575 m)   Wt 64.2 kg   SpO2 100%   BMI 25.89 kg/m   .Intake/Output      12/25 0701 - 12/26 0700 12/26 0701 - 12/27 0700   I.V. (mL/kg) 97 (1.5) 3412.4 (53.2)   Blood  226   IV Piggyback  1170   Total Intake(mL/kg) 97 (1.5) 4808.3 (74.9)   Urine (mL/kg/hr) 600 1335 (1.7)   Emesis/NG output  0   Chest Tube  350   Total Output 600 1685   Net -503 +3123.3          . sodium chloride Stopped (2017-12-14 1747)  . [START ON 07/01/2018] sodium chloride    . sodium chloride 10 mL/hr at 2017-12-14 1359  . albumin human 12.5 g (2017-12-14 1839)  . dexmedetomidine (PRECEDEX) IV infusion Stopped (2017-12-14 1549)  . DOPamine    . epinephrine 6 mcg/min (2017-12-14 1800)  . famotidine (PEPCID) IV Stopped (2017-12-14 1435)  . insulin 5.6 mL/hr at 2017-12-14 1800  . lactated ringers    . lactated ringers    . lactated ringers    . [START ON 07/01/2018] levofloxacin (LEVAQUIN) IV    . magnesium sulfate 20 mL/hr at 2017-12-14 1800  . milrinone 0.3 mcg/kg/min (2017-12-14 1800)  . nitroGLYCERIN    . norepinephrine (LEVOPHED) Adult infusion Stopped (2017-12-14 1741)  . phenylephrine (NEO-SYNEPHRINE) Adult infusion    . vancomycin       Lab Results  Component Value Date   WBC 13.3 (H) 06-15-18   HGB 7.8 (L) 06-15-18   HCT 23.0 (L) 06-15-18   PLT 144 (L) 06-15-18   GLUCOSE 170 (H) 06-15-18   CHOL 237 (H) 06-15-18   TRIG 139 06-15-18   HDL 31 (L) 06-15-18   LDLCALC 178 (H) 06-15-18   ALT 24 06-15-18   AST 125 (H) 06-15-18   NA 143 06-15-18   K 3.6 06-15-18   CL  105 06-15-18   CREATININE 1.10 06-15-18   BUN 15 06-15-18   CO2 22 06-15-18   TSH 4.116 06/26/2018   INR 2.02 06-15-18   HGBA1C 6.1 02/25/2013   Sedated  on vent presidex off two hours Not bleeding ci 1.8 on milrinone, levaphed, Ardith Darkepi   Ndrew Creason B Yeimi Debnam MD  Beeper [phone] Office [phone] 06-15-18 6:55 PM

## 2018-06-30 NOTE — Anesthesia Procedure Notes (Signed)
Central Venous Catheter Insertion Performed by: Dorris SinghGreen, Maynor Mwangi, MD, anesthesiologist Start/End2019-04-04 6:35 AM, 2017-10-07 6:50 AM Patient location: Pre-op. Preanesthetic checklist: patient identified, IV checked, site marked, risks and benefits discussed, surgical consent, monitors and equipment checked, pre-op evaluation and timeout performed Position: Trendelenburg Lidocaine 1% used for infiltration and patient sedated Hand hygiene performed , maximum sterile barriers used  and Seldinger technique used PA cath was placed.Sheath introducer PA Cath depth:48 Procedure performed using ultrasound guided technique. Ultrasound Notes:anatomy identified and image(s) printed for medical record Attempts: 1 Following insertion, line sutured, dressing applied and Biopatch. Post procedure assessment: blood return through all ports  Patient tolerated the procedure well with no immediate complications.

## 2018-06-30 NOTE — Anesthesia Procedure Notes (Signed)
Procedure Name: Intubation Date/Time: 06/07/2018 8:03 AM Performed by: Bryson Corona, CRNA Pre-anesthesia Checklist: Patient identified, Emergency Drugs available, Suction available and Patient being monitored Patient Re-evaluated:Patient Re-evaluated prior to induction Oxygen Delivery Method: Circle System Utilized Preoxygenation: Pre-oxygenation with 100% oxygen Induction Type: IV induction Ventilation: Mask ventilation without difficulty Laryngoscope Size: Mac and 4 Grade View: Grade I Tube type: Oral Tube size: 8.0 mm Number of attempts: 1 Airway Equipment and Method: Stylet and Oral airway Placement Confirmation: ETT inserted through vocal cords under direct vision,  positive ETCO2 and breath sounds checked- equal and bilateral Secured at: 24 cm Tube secured with: Tape Dental Injury: Teeth and Oropharynx as per pre-operative assessment

## 2018-06-30 NOTE — Brief Op Note (Addendum)
06/07/2018 - 06/27/2018  11:47 AM  PATIENT:  Michael Hess  67 y.o. male  PRE-OPERATIVE DIAGNOSIS:  3 VESSEL CAD  POST-OPERATIVE DIAGNOSIS:  3 VESSEL CAD  PROCEDURE:  Procedure(s): CORONARY ARTERY BYPASS GRAFTING (CABG) (N/A) TRANSESOPHAGEAL ECHOCARDIOGRAM (TEE) (N/A) ENDOVEIN HARVEST OF GREATER SAPHENOUS VEIN (Right)  CABGx4 with:   LIMA-LAD   SVG-D1   SVG-OM1   SVG-distal Acute Marginal  SURGEON:  Surgeon(s) and Role:    * Loreli SlotHendrickson, Korben Carcione C, MD - Primary  PHYSICIAN ASSISTANT: Gaynelle ArabianM. Roddenberry, PA-C  ANESTHESIA:   general  EBL:  Minimal  BLOOD ADMINISTERED:none  DRAINS: Mediastinal and left pleural   LOCAL MEDICATIONS USED:  NONE  SPECIMEN:  No Specimen  DISPOSITION OF SPECIMEN:  N/A  COUNTS:  YES  DICTATION: done  PLAN OF CARE: Admit to inpatient   PATIENT DISPOSITION:  ICU - intubated and hemodynamically stable.   Delay start of Pharmacological VTE agent (>24hrs) due to surgical blood loss or risk of bleeding: yes   Pre-op weight 64.2kg

## 2018-06-30 NOTE — Progress Notes (Signed)
*  PRELIMINARY RESULTS* Echocardiogram Echocardiogram Transesophageal has been performed.  Belva ChimesWendy  Haely Leyland 06/19/2018, 8:23 AM

## 2018-06-30 NOTE — Anesthesia Preprocedure Evaluation (Addendum)
Anesthesia Evaluation  Patient identified by MRN, date of birth, ID band Patient awake    Reviewed: Allergy & Precautions, NPO status , Patient's Chart, lab work & pertinent test results  History of Anesthesia Complications Negative for: history of anesthetic complications  Airway Mallampati: II  TM Distance: >3 FB Neck ROM: Full    Dental  (+) Dental Advisory Given   Pulmonary neg pulmonary ROS,    breath sounds clear to auscultation       Cardiovascular hypertension, Pt. on medications and Pt. on home beta blockers (-) angina+ CAD and + Past MI   Rhythm:Regular Rate:Bradycardia  02/10/18 Cath: Severe three-vessel coronary artery disease, chronic occlusion of the right coronary artery with left-to-right collaterals, high-grade stenosis in the ostial LAD and mid left circumflex.  EF of 40%   Neuro/Psych negative neurological ROS     GI/Hepatic Neg liver ROS, N/v this am   Endo/Other  negative endocrine ROS  Renal/GU Renal InsufficiencyRenal disease (creat 1.28)     Musculoskeletal   Abdominal   Peds  Hematology negative hematology ROS (+)   Anesthesia Other Findings   Reproductive/Obstetrics                            Anesthesia Physical Anesthesia Plan  ASA: IV  Anesthesia Plan: General   Post-op Pain Management:    Induction: Intravenous  PONV Risk Score and Plan: 2 and Treatment may vary due to age or medical condition  Airway Management Planned: Oral ETT  Additional Equipment: Arterial line, PA Cath, TEE and Ultrasound Guidance Line Placement  Intra-op Plan:   Post-operative Plan: Post-operative intubation/ventilation  Informed Consent: I have reviewed the patients History and Physical, chart, labs and discussed the procedure including the risks, benefits and alternatives for the proposed anesthesia with the patient or authorized representative who has indicated his/her  understanding and acceptance.   Dental advisory given  Plan Discussed with: CRNA and Surgeon  Anesthesia Plan Comments:        Anesthesia Quick Evaluation

## 2018-06-30 NOTE — Anesthesia Procedure Notes (Signed)
Arterial Line Insertion Start/End12/21/2019 6:39 AM, 06/30/2018 6:44 AM Performed by: Alvera NovelPike, Zaelynn Fuchs H, CRNA, CRNA  Patient location: Pre-op. Preanesthetic checklist: patient identified, IV checked, site marked, risks and benefits discussed, surgical consent, monitors and equipment checked, pre-op evaluation, timeout performed and anesthesia consent Lidocaine 1% used for infiltration Left, radial was placed Catheter size: 20 Fr Hand hygiene performed  and maximum sterile barriers used   Attempts: 1 Procedure performed without using ultrasound guided technique. Following insertion, dressing applied. Post procedure assessment: normal and unchanged  Patient tolerated the procedure well with no immediate complications.

## 2018-06-30 NOTE — Op Note (Signed)
NAMAdam Phenix: [place], [place] MEDICAL RECORD HQ:[id number] ACCOUNT [id number] DATE OF BIRTH:Jul 18, 1950 FACILITY: [facility] LOCATION: [facility] PHYSICIAN:Wallace Cogliano C. Hinata Diener, MD  OPERATIVE REPORT  DATE OF PROCEDURE:  06/17/2018  PREOPERATIVE DIAGNOSIS:  Severe 3-vessel coronary artery disease, status post myocardial infarction.  POSTOPERATIVE DIAGNOSIS:  Severe 3-vessel coronary artery disease, status post myocardial infarction.  PROCEDURE:   Median sternotomy, extracorporeal circulation, Coronary artery bypass grafting x 4.    Left internal mammary artery to left anterior descending.    Saphenous vein graft to first diagonal.    Saphenous vein graft to obtuse marginal 1.    Saphenous vein graft to acute marginal.   Endoscopic vein harvest right leg.  SURGEON:  Charlett LangoSteven Taurean Ju, MD  ASSISTANT:  Jillyn HiddenMyron Roddenberry, PA.  ANESTHESIA:  General.  FINDINGS:  Transesophageal echocardiography showed ejection fraction of approximately 40% with inferior hypokinesis, no significant valvular pathology.  Good quality targets.  Good quality conduits.  Post-bypass transesophageal echocardiography  unchanged.  CLINICAL NOTE:  The patient is a 67 year old gentleman with a history of coronary artery disease who presented with unstable chest pain.  He was taken to the cath lab at [facility] emergently as a ST-elevation MI.  His pain resolved but he was  found to have critical 3-vessel disease with 99% stenoses in the LAD and circumflex and a totally occluded right coronary.  He was [facility] for urgent coronary artery bypass grafting.  As the patient was stable and chest pain free on  arrival, he was scheduled for surgery this morning.  The indications, risks, benefits, and alternatives were discussed in detail with the patient.  He understood and accepted the risks and agreed to proceed.  DESCRIPTION OF PROCEDURE:  The patient was brought to the preoperative holding area on 06/06/2018.   Anesthesia under direction of Dr. Jairo Benarswell Jackson and placed an arterial blood pressure monitoring line and a Swan-Ganz catheter.  He was taken to the  operating room, anesthetized and intubated.  A Foley catheter was placed.  Transesophageal echocardiography was performed and it revealed inferior hypokinesis and scar with ejection fraction of approximately 40%.  His initial cardiac index on arrival to  the operating room was 0.8 liters per minute per meter squared.  The chest, abdomen and legs were prepped and draped in the usual sterile fashion.  A timeout was performed.  An incision was made in the medial aspect of the right leg at the level of the knee.  Greater saphenous vein was harvested from the upper calf to the groin endoscopically.  The saphenous vein was a good quality vessel.   Simultaneously with the saphenous vein harvest, a median sternotomy was performed and the left internal mammary artery was harvested using standard technique.  This was relatively difficult due to the patient's body habitus and relatively stiff chest  wall.  The mammary was a good quality vessel.  Two thousand units of heparin was administered during the vessel harvest.  The remainder of the full heparin dose was given prior to opening the pericardium.  After the conduits had been harvested, the pericardium was opened.  The ascending aorta was inspected.  It was of normal size with no evidence of atherosclerotic disease.  After confirming adequate anticoagulation with ACT measurement, the aorta was  cannulated via concentric 2-0 Ethibond pledgeted pursestring sutures.  A dual stage venous cannula was placed via a pursestring suture in the right atrial appendage.  Cardiopulmonary bypass was initiated.  Flows were maintained per protocol.  The patient  was  cooled to 32 degrees Celsius.  The coronary arteries were inspected and anastomotic sites were chosen.  The conduits were inspected and cut to length.  A foam pad was  placed in the pericardium to insulate the heart.  The temperature probe was  placed in the myocardial septum.  A retrograde cardioplegia cannula was placed via a pursestring suture in the right atrium and directed into the coronary sinus.  An antegrade cardioplegia cannula was placed in the ascending aorta.  The aorta was cross clamped.  The left ventricle was emptied via the aortic root vent.  Cardiac arrest was achieved with a combination of cold antegrade and retrograde blood cardioplegia and topical iced saline.  An initial 500 mL of cardioplegia was  given antegrade.  There was a rapid diastolic arrest.  An additional 500 mL of cardioplegia was given retrograde.  There was septal cooling to 10 degrees Celsius.  A reversed saphenous vein graft was placed end-to-side to an acute marginal branch of the right coronary.  This was the only graftable branch off the right coronary.  It crossed the acute margin and traversed towards the distal portion of the septum.  It  was a 1.5 mm good quality target.  The vein was of good quality.  It was anastomosed end-to-side with running 7-0 Prolene suture.  A probe passed easily proximally and distally.  Cardioplegia was administered.  There was good flow and there was good  hemostasis.  A reversed saphenous vein graft then was placed end-to-side to the obtuse marginal 1.  This was the lateral wall branch.  It was a large bifurcating vessel.  There was a tight stenosis just proximal to the bifurcation.  It was grafted on the more distal  limb of the bifurcation.  This vessel was a 1.5 mm good quality target.  The vein graft was of good quality.  The end-to-side anastomosis was performed with a running 7-0 Prolene suture.  A probe passed easily proximally and distally and there was  good flow and good hemostasis with cardioplegia administration.  Additional cardioplegia was administered via the retrograde cannula.  The heart then was elevated.  The first diagonal  was a large anterolateral branch.  It was a 1.5 mm target vessel of good quality.  The vein was anastomosed end-to-side with a  running 7-0 Prolene suture.  A probe passed easily proximally and distally.  Cardioplegia was administered.  There was good flow and good hemostasis.  The left internal mammary artery was brought through a window in the pericardium.  The distal limb was bevelled.  It was anastomosed end-to-side to the distal LAD.  These were both 1.5 mm good quality vessels.  The anastomosis was performed with a  running 8-0 Prolene suture.  At the completion of the mammary to LAD anastomosis, the bulldog clamp was briefly removed to inspect for hemostasis.  Septal rewarming was noted.  The bulldog clamp was replaced and the mammary pedicle was tacked to the  epicardial surface of the heart with 6-0 Prolene sutures.  Additional cardioplegia was administered.  The vein grafts were cut to length.  The cardioplegia cannula was removed from the ascending aorta.  The proximal vein graft anastomoses were performed to 4.5 mm punch aortotomies with running 6-0 Prolene  sutures.  At the completion of the final proximal anastomosis, the patient was placed in Trendelenburg position.  Lidocaine was administered.  Retrograde cardioplegia was administered as de-airing was performed and the aortic crossclamp was removed.  The  total crossclamp time was 83 minutes.  The patient required 2 defibrillations with 10 joules and initially was in a bradycardic rhythm and subsequently resumed sinus rhythm.  While rewarming was completed, all proximal and distal anastomoses were  inspected for hemostasis.  The patient was loaded with milrinone and an infusion was started at 0.25 mcg per kilogram per minute.  Dopamine infusion was initiated at 3 mcg per kilogram per minute.  Epicardial pacing wires were placed on the right  ventricle and right atrium.  The patient was DDD paced at 90 beats per minute.  When the patient  had rewarmed to a core temperature of 37 degrees Celsius, he was weaned from cardiopulmonary bypass.  The cannulas were left in place.  Post-bypass  transesophageal echocardiography revealed no significant change in left ventricular function, but the right ventricle appeared to be relatively hypokinetic initially.  After several minutes with the blood pressure decreasing and the pulmonary artery  pressures rising, the patient was placed back on cardiopulmonary bypass to rest the heart.  The dopamine infusion was discontinued.  An epinephrine infusion was initiated at 5 mcg per kilogram per minute.  The milrinone was increased to 0.375 mcg/kg  per minute, and a Norepinephrine infusion was initiated at 5 mcg per kilogram per minute.  After allowing the heart to rest, the rhythm picked up and he went into sinus tachycardia.  The patient was placed on VVI backup.  On the second attempt, the  patient weaned from bypass without difficulty.  The initial cardiac index was approximately 1.5 liters per minute meters squared but gradually improved during the post-bypass period.  Other hemodynamic parameters were much improved.  Transesophageal  echocardiography showed improved left ventricular wall motion and improved right ventricular wall motion as well.  The bypass time for the first run was 122 minutes.  The second run on bypass was 13 minutes.  A test dose of protamine was administered and  was well tolerated.  The atrial and aortic cannulae were removed.  The remainder of the protamine was administered without incident.  The chest was irrigated with warm saline.  Hemostasis was achieved.  The patient was thrombocytopenic and a platelet  infusion was administered.  Left pleural and mediastinal chest tubes were placed through separate subcostal incisions.  The pericardium was not closed.  The sternum was closed with a combination of single and double heavy gauge stainless steel wires.  The  pectoralis fascia,  subcutaneous tissue and skin were closed in standard fashion.  All sponge, needle and instrument counts were correct at the end of the procedure.  The patient was taken from the operating room to the surgical Intensive Care Unit in  critical but stable condition.  TN/NUANCE  D:06/18/2018 T:06/05/2018 JOB:[id number]

## 2018-06-30 NOTE — Interval H&P Note (Signed)
History and Physical Interval Note:  06/14/2018 7:28 AM  Michael Hess  has presented today for surgery, with the diagnosis of 3 VESSEL CAD  The various methods of treatment have been discussed with the patient and family. After consideration of risks, benefits and other options for treatment, the patient has consented to  Procedure(s): CORONARY ARTERY BYPASS GRAFTING (CABG) (N/A) TRANSESOPHAGEAL ECHOCARDIOGRAM (TEE) (N/A) as a surgical intervention .  The patient's history has been reviewed, patient examined, no change in status, stable for surgery.  I have reviewed the patient's chart and labs.  Questions were answered to the patient's satisfaction.     Loreli SlotSteven C Janiya Millirons

## 2018-06-30 NOTE — Transfer of Care (Signed)
Immediate Anesthesia Transfer of Care Note  Patient: Michael Hess  Procedure(s) Performed: CORONARY ARTERY BYPASS GRAFTING (CABG) (N/A Chest) TRANSESOPHAGEAL ECHOCARDIOGRAM (TEE) (N/A ) ENDOVEIN HARVEST OF GREATER SAPHENOUS VEIN (Right Leg Lower)  Patient Location: ICU  Anesthesia Type:General  Level of Consciousness: Patient remains intubated per anesthesia plan  Airway & Oxygen Therapy: Patient remains intubated per anesthesia plan and Patient placed on Ventilator (see vital sign flow sheet for setting)  Post-op Assessment: Report given to RN and Post -op Vital signs reviewed and stable  Post vital signs: Reviewed and stable  Last Vitals:  Vitals Value Taken Time  BP    Temp 35.7 C May 28, 2018  2:08 PM  Pulse 119 May 28, 2018  2:04 PM  Resp 12 May 28, 2018  2:08 PM  SpO2 100 % May 28, 2018  2:04 PM  Vitals shown include unvalidated device data.  Last Pain:  Vitals:   2018/04/24 0400  TempSrc: Oral  PainSc:          Complications: No apparent anesthesia complications

## 2018-07-01 ENCOUNTER — Encounter: Payer: Self-pay | Admitting: Anesthesiology

## 2018-07-01 ENCOUNTER — Inpatient Hospital Stay (HOSPITAL_COMMUNITY): Payer: Medicare Other

## 2018-07-01 ENCOUNTER — Encounter (HOSPITAL_COMMUNITY): Payer: Self-pay | Admitting: Thoracic Surgery (Cardiothoracic Vascular Surgery)

## 2018-07-01 ENCOUNTER — Inpatient Hospital Stay (HOSPITAL_COMMUNITY): Payer: Medicare Other | Admitting: Anesthesiology

## 2018-07-01 ENCOUNTER — Other Ambulatory Visit: Payer: Self-pay

## 2018-07-01 DIAGNOSIS — I213 ST elevation (STEMI) myocardial infarction of unspecified site: Secondary | ICD-10-CM

## 2018-07-01 DIAGNOSIS — R0602 Shortness of breath: Secondary | ICD-10-CM

## 2018-07-01 LAB — POCT I-STAT 3, ART BLOOD GAS (G3+)
Acid-base deficit: 1 mmol/L (ref 0.0–2.0)
Acid-base deficit: 5 mmol/L — ABNORMAL HIGH (ref 0.0–2.0)
Acid-base deficit: 6 mmol/L — ABNORMAL HIGH (ref 0.0–2.0)
Acid-base deficit: 4 mmol/L — ABNORMAL HIGH (ref 0.0–2.0)
Acid-base deficit: 9 mmol/L — ABNORMAL HIGH (ref 0.0–2.0)
Acid-base deficit: 5 mmol/L — ABNORMAL HIGH (ref 0.0–2.0)
Acid-base deficit: 9 mmol/L — ABNORMAL HIGH (ref 0.0–2.0)
Acid-base deficit: 8 mmol/L — ABNORMAL HIGH (ref 0.0–2.0)
BICARBONATE: 18.2 mmol/L — AB (ref 20.0–28.0)
BICARBONATE: 18.6 mmol/L — AB (ref 20.0–28.0)
Bicarbonate: 25 mmol/L (ref 20.0–28.0)
Bicarbonate: 21.7 mmol/L (ref 20.0–28.0)
Bicarbonate: 19.1 mmol/L — ABNORMAL LOW (ref 20.0–28.0)
Bicarbonate: 20.6 mmol/L (ref 20.0–28.0)
Bicarbonate: 15.7 mmol/L — ABNORMAL LOW (ref 20.0–28.0)
Bicarbonate: 23.1 mmol/L (ref 20.0–28.0)
O2 Saturation: 100 %
O2 Saturation: 99 %
O2 Saturation: 98 %
O2 Saturation: 98 %
O2 Saturation: 88 %
O2 Saturation: 85 %
O2 Saturation: 86 %
O2 Saturation: 89 %
PCO2 ART: 30.1 mmHg — AB (ref 32.0–48.0)
PH ART: 7.321 — AB (ref 7.350–7.450)
PO2 ART: 58 mmHg — AB (ref 83.0–108.0)
Patient temperature: 35.4
Patient temperature: 37
Patient temperature: 98
Patient temperature: 97.3
Patient temperature: 97.3
Patient temperature: 97.5
TCO2: 26 mmol/L (ref 22–32)
TCO2: 23 mmol/L (ref 22–32)
TCO2: 20 mmol/L — ABNORMAL LOW (ref 22–32)
TCO2: 22 mmol/L (ref 22–32)
TCO2: 17 mmol/L — ABNORMAL LOW (ref 22–32)
TCO2: 25 mmol/L (ref 22–32)
TCO2: 19 mmol/L — ABNORMAL LOW (ref 22–32)
TCO2: 20 mmol/L — ABNORMAL LOW (ref 22–32)
pCO2 arterial: 41.7 mmHg (ref 32.0–48.0)
pCO2 arterial: 51.1 mmHg — ABNORMAL HIGH (ref 32.0–48.0)
pCO2 arterial: 37.3 mmHg (ref 32.0–48.0)
pCO2 arterial: 33.5 mmHg (ref 32.0–48.0)
pCO2 arterial: 59.7 mmHg — ABNORMAL HIGH (ref 32.0–48.0)
pCO2 arterial: 42 mmHg (ref 32.0–48.0)
pCO2 arterial: 39.7 mmHg (ref 32.0–48.0)
pH, Arterial: 7.378 (ref 7.350–7.450)
pH, Arterial: 7.236 — ABNORMAL LOW (ref 7.350–7.450)
pH, Arterial: 7.397 (ref 7.350–7.450)
pH, Arterial: 7.325 — ABNORMAL LOW (ref 7.350–7.450)
pH, Arterial: 7.191 — CL (ref 7.350–7.450)
pH, Arterial: 7.24 — ABNORMAL LOW (ref 7.350–7.450)
pH, Arterial: 7.276 — ABNORMAL LOW (ref 7.350–7.450)
pO2, Arterial: 174 mmHg — ABNORMAL HIGH (ref 83.0–108.0)
pO2, Arterial: 137 mmHg — ABNORMAL HIGH (ref 83.0–108.0)
pO2, Arterial: 115 mmHg — ABNORMAL HIGH (ref 83.0–108.0)
pO2, Arterial: 99 mmHg (ref 83.0–108.0)
pO2, Arterial: 57 mmHg — ABNORMAL LOW (ref 83.0–108.0)
pO2, Arterial: 60 mmHg — ABNORMAL LOW (ref 83.0–108.0)
pO2, Arterial: 62 mmHg — ABNORMAL LOW (ref 83.0–108.0)

## 2018-07-01 LAB — CBC
HCT: 24 % — ABNORMAL LOW (ref 39.0–52.0)
HCT: 26.1 % — ABNORMAL LOW (ref 39.0–52.0)
Hemoglobin: 7.4 g/dL — ABNORMAL LOW (ref 13.0–17.0)
Hemoglobin: 8.2 g/dL — ABNORMAL LOW (ref 13.0–17.0)
MCH: 22.9 pg — ABNORMAL LOW (ref 26.0–34.0)
MCH: 23.5 pg — ABNORMAL LOW (ref 26.0–34.0)
MCHC: 30.8 g/dL (ref 30.0–36.0)
MCHC: 31.4 g/dL (ref 30.0–36.0)
MCV: 74.3 fL — ABNORMAL LOW (ref 80.0–100.0)
MCV: 74.8 fL — ABNORMAL LOW (ref 80.0–100.0)
NRBC: 0.1 % (ref 0.0–0.2)
Platelets: 138 10*3/uL — ABNORMAL LOW (ref 150–400)
Platelets: UNDETERMINED 10*3/uL (ref 150–400)
RBC: 3.23 MIL/uL — ABNORMAL LOW (ref 4.22–5.81)
RBC: 3.49 MIL/uL — ABNORMAL LOW (ref 4.22–5.81)
RDW: 16 % — ABNORMAL HIGH (ref 11.5–15.5)
RDW: 16.9 % — ABNORMAL HIGH (ref 11.5–15.5)
WBC: 9.9 10*3/uL (ref 4.0–10.5)
WBC: 15.3 10*3/uL — ABNORMAL HIGH (ref 4.0–10.5)
nRBC: 0 % (ref 0.0–0.2)

## 2018-07-01 LAB — CREATININE, SERUM
Creatinine, Ser: 1.99 mg/dL — ABNORMAL HIGH (ref 0.61–1.24)
GFR calc non Af Amer: 34 mL/min — ABNORMAL LOW (ref >60)
GFR, EST AFRICAN AMERICAN: 39 mL/min — AB (ref >60)

## 2018-07-01 LAB — BASIC METABOLIC PANEL
Anion gap: 12 (ref 5–15)
Anion gap: 25 — ABNORMAL HIGH (ref 5–15)
BUN: 18 mg/dL (ref 8–23)
BUN: 20 mg/dL (ref 8–23)
CO2: 23 mmol/L (ref 22–32)
CO2: 21 mmol/L — ABNORMAL LOW (ref 22–32)
Calcium: 7 mg/dL — ABNORMAL LOW (ref 8.9–10.3)
Calcium: 6.5 mg/dL — ABNORMAL LOW (ref 8.9–10.3)
Chloride: 110 mmol/L (ref 98–111)
Chloride: 103 mmol/L (ref 98–111)
Creatinine, Ser: 1.67 mg/dL — ABNORMAL HIGH (ref 0.61–1.24)
Creatinine, Ser: 2.55 mg/dL — ABNORMAL HIGH (ref 0.61–1.24)
GFR calc Af Amer: 48 mL/min — ABNORMAL LOW (ref >60)
GFR calc Af Amer: 29 mL/min — ABNORMAL LOW (ref >60)
GFR calc non Af Amer: 41 mL/min — ABNORMAL LOW (ref >60)
GFR calc non Af Amer: 25 mL/min — ABNORMAL LOW (ref >60)
Glucose, Bld: 121 mg/dL — ABNORMAL HIGH (ref 70–99)
Glucose, Bld: 161 mg/dL — ABNORMAL HIGH (ref 70–99)
Potassium: 3.2 mmol/L — ABNORMAL LOW (ref 3.5–5.1)
Potassium: 4.9 mmol/L (ref 3.5–5.1)
Sodium: 145 mmol/L (ref 135–145)
Sodium: 149 mmol/L — ABNORMAL HIGH (ref 135–145)

## 2018-07-01 LAB — POCT I-STAT, CHEM 8
BUN: 15 mg/dL (ref 8–23)
BUN: 21 mg/dL (ref 8–23)
BUN: 27 mg/dL — ABNORMAL HIGH (ref 8–23)
CHLORIDE: 101 mmol/L (ref 98–111)
Calcium, Ion: 0.97 mmol/L — ABNORMAL LOW (ref 1.15–1.40)
Calcium, Ion: 0.95 mmol/L — ABNORMAL LOW (ref 1.15–1.40)
Calcium, Ion: 0.79 mmol/L — CL (ref 1.15–1.40)
Chloride: 107 mmol/L (ref 98–111)
Chloride: 106 mmol/L (ref 98–111)
Creatinine, Ser: 1.3 mg/dL — ABNORMAL HIGH (ref 0.61–1.24)
Creatinine, Ser: 1.9 mg/dL — ABNORMAL HIGH (ref 0.61–1.24)
Creatinine, Ser: 2.2 mg/dL — ABNORMAL HIGH (ref 0.61–1.24)
Glucose, Bld: 153 mg/dL — ABNORMAL HIGH (ref 70–99)
Glucose, Bld: 133 mg/dL — ABNORMAL HIGH (ref 70–99)
Glucose, Bld: 149 mg/dL — ABNORMAL HIGH (ref 70–99)
HCT: 26 % — ABNORMAL LOW (ref 39.0–52.0)
HCT: 22 % — ABNORMAL LOW (ref 39.0–52.0)
HEMATOCRIT: 20 % — AB (ref 39.0–52.0)
Hemoglobin: 6.8 g/dL — CL (ref 13.0–17.0)
Hemoglobin: 8.8 g/dL — ABNORMAL LOW (ref 13.0–17.0)
Hemoglobin: 7.5 g/dL — ABNORMAL LOW (ref 13.0–17.0)
POTASSIUM: 3.9 mmol/L (ref 3.5–5.1)
Potassium: 3.9 mmol/L (ref 3.5–5.1)
Potassium: 4.8 mmol/L (ref 3.5–5.1)
Sodium: 144 mmol/L (ref 135–145)
Sodium: 140 mmol/L (ref 135–145)
Sodium: 146 mmol/L — ABNORMAL HIGH (ref 135–145)
TCO2: 23 mmol/L (ref 22–32)
TCO2: 23 mmol/L (ref 22–32)
TCO2: 23 mmol/L (ref 22–32)

## 2018-07-01 LAB — PREPARE PLATELET PHERESIS: Unit division: 0

## 2018-07-01 LAB — HEMOGLOBIN A1C
Hgb A1c MFr Bld: 5.8 % — ABNORMAL HIGH (ref 4.8–5.6)
Hgb A1c MFr Bld: 5.8 % — ABNORMAL HIGH (ref 4.8–5.6)
Mean Plasma Glucose: 120 mg/dL
Mean Plasma Glucose: 120 mg/dL

## 2018-07-01 LAB — COOXEMETRY PANEL
Carboxyhemoglobin: 1.1 % (ref 0.5–1.5)
Methemoglobin: 2 % — ABNORMAL HIGH (ref 0.0–1.5)
O2 Saturation: 44.2 %
Total hemoglobin: 7.3 g/dL — ABNORMAL LOW (ref 12.0–16.0)

## 2018-07-01 LAB — GLUCOSE, CAPILLARY
Glucose-Capillary: 100 mg/dL — ABNORMAL HIGH (ref 70–99)
Glucose-Capillary: 103 mg/dL — ABNORMAL HIGH (ref 70–99)
Glucose-Capillary: 103 mg/dL — ABNORMAL HIGH (ref 70–99)
Glucose-Capillary: 104 mg/dL — ABNORMAL HIGH (ref 70–99)
Glucose-Capillary: 108 mg/dL — ABNORMAL HIGH (ref 70–99)
Glucose-Capillary: 109 mg/dL — ABNORMAL HIGH (ref 70–99)
Glucose-Capillary: 117 mg/dL — ABNORMAL HIGH (ref 70–99)
Glucose-Capillary: 123 mg/dL — ABNORMAL HIGH (ref 70–99)
Glucose-Capillary: 124 mg/dL — ABNORMAL HIGH (ref 70–99)
Glucose-Capillary: 125 mg/dL — ABNORMAL HIGH (ref 70–99)
Glucose-Capillary: 131 mg/dL — ABNORMAL HIGH (ref 70–99)
Glucose-Capillary: 135 mg/dL — ABNORMAL HIGH (ref 70–99)
Glucose-Capillary: 144 mg/dL — ABNORMAL HIGH (ref 70–99)
Glucose-Capillary: 154 mg/dL — ABNORMAL HIGH (ref 70–99)
Glucose-Capillary: 89 mg/dL (ref 70–99)
Glucose-Capillary: 95 mg/dL (ref 70–99)
Glucose-Capillary: 98 mg/dL (ref 70–99)

## 2018-07-01 LAB — MAGNESIUM
Magnesium: 2.9 mg/dL — ABNORMAL HIGH (ref 1.7–2.4)
Magnesium: 2.8 mg/dL — ABNORMAL HIGH (ref 1.7–2.4)
Magnesium: 3.1 mg/dL — ABNORMAL HIGH (ref 1.7–2.4)

## 2018-07-01 LAB — POCT I-STAT 4, (NA,K, GLUC, HGB,HCT)
Glucose, Bld: 103 mg/dL — ABNORMAL HIGH (ref 70–99)
HCT: 28 % — ABNORMAL LOW (ref 39.0–52.0)
HEMOGLOBIN: 9.5 g/dL — AB (ref 13.0–17.0)
Potassium: 3.6 mmol/L (ref 3.5–5.1)
Sodium: 144 mmol/L (ref 135–145)

## 2018-07-01 LAB — BPAM PLATELET PHERESIS
Blood Product Expiration Date: 201912262359
ISSUE DATE / TIME: 201912261304
Unit Type and Rh: 5100

## 2018-07-01 LAB — PREPARE RBC (CROSSMATCH)

## 2018-07-01 MED ORDER — AMIODARONE HCL IN DEXTROSE 360-4.14 MG/200ML-% IV SOLN
30.0000 mg/h | INTRAVENOUS | Status: DC
  Administered 2018-07-02 (×4): 30 mg/h via INTRAVENOUS
  Filled 2018-07-01 (×4): qty 200

## 2018-07-01 MED ORDER — AMIODARONE HCL IN DEXTROSE 360-4.14 MG/200ML-% IV SOLN
INTRAVENOUS | Status: AC
  Administered 2018-07-01: 60 mg/h via INTRAVENOUS
  Filled 2018-07-01: qty 200

## 2018-07-01 MED ORDER — LEVALBUTEROL HCL 0.63 MG/3ML IN NEBU: 0.6300 mg | INHALATION_SOLUTION | RESPIRATORY_TRACT | Status: DC | PRN

## 2018-07-01 MED ORDER — ENOXAPARIN SODIUM 30 MG/0.3ML ~~LOC~~ SOLN
30.0000 mg | Freq: Every day | SUBCUTANEOUS | Status: DC
  Administered 2018-07-02: 30 mg via SUBCUTANEOUS
  Filled 2018-07-01: qty 0.3

## 2018-07-01 MED ORDER — SODIUM BICARBONATE 8.4 % IV SOLN
INTRAVENOUS | Status: AC
  Filled 2018-07-01: qty 100

## 2018-07-01 MED ORDER — AMIODARONE LOAD VIA INFUSION
150.0000 mg | Freq: Once | INTRAVENOUS | Status: AC
  Administered 2018-07-01: 150 mg via INTRAVENOUS
  Filled 2018-07-01: qty 83.34

## 2018-07-01 MED ORDER — SODIUM CHLORIDE 0.9% IV SOLUTION: Freq: Once | INTRAVENOUS | Status: DC

## 2018-07-01 MED ORDER — DEXMEDETOMIDINE HCL IN NACL 200 MCG/50ML IV SOLN
0.4000 ug/kg/h | INTRAVENOUS | Status: DC
  Administered 2018-07-01: 0.4 ug/kg/h via INTRAVENOUS
  Administered 2018-07-02: 0.7 ug/kg/h via INTRAVENOUS
  Administered 2018-07-02 (×2): 0.9 ug/kg/h via INTRAVENOUS
  Filled 2018-07-01: qty 100
  Filled 2018-07-01 (×3): qty 50

## 2018-07-01 MED ORDER — POTASSIUM CHLORIDE 10 MEQ/50ML IV SOLN
10.0000 meq | INTRAVENOUS | Status: AC | PRN
  Administered 2018-07-01 (×3): 10 meq via INTRAVENOUS
  Filled 2018-07-01 (×2): qty 50

## 2018-07-01 MED ORDER — SODIUM CHLORIDE 0.9 % IV SOLN
2.0000 g | INTRAVENOUS | Status: DC
  Administered 2018-07-02: 2 g via INTRAVENOUS
  Filled 2018-07-01 (×2): qty 20

## 2018-07-01 MED ORDER — NOREPINEPHRINE 16 MG/250ML-% IV SOLN
0.0000 ug/min | INTRAVENOUS | Status: DC
  Administered 2018-07-01: 40 ug/min via INTRAVENOUS
  Administered 2018-07-01: 80 ug/min via INTRAVENOUS
  Administered 2018-07-01: 40 ug/min via INTRAVENOUS
  Administered 2018-07-02: 75 ug/min via INTRAVENOUS
  Administered 2018-07-02: 44 ug/min via INTRAVENOUS
  Administered 2018-07-02: 75 ug/min via INTRAVENOUS
  Administered 2018-07-02: 65 ug/min via INTRAVENOUS
  Administered 2018-07-03 (×2): 70 ug/min via INTRAVENOUS
  Filled 2018-07-01 (×10): qty 250

## 2018-07-01 MED ORDER — PANTOPRAZOLE SODIUM 40 MG IV SOLR
40.0000 mg | INTRAVENOUS | Status: DC
  Administered 2018-07-02 (×2): 40 mg via INTRAVENOUS
  Filled 2018-07-01 (×2): qty 40

## 2018-07-01 MED ORDER — FUROSEMIDE 10 MG/ML IJ SOLN
4.0000 mg/h | INTRAVENOUS | Status: DC
  Administered 2018-07-01 – 2018-07-02 (×2): 4 mg/h via INTRAVENOUS
  Filled 2018-07-01: qty 21

## 2018-07-01 MED ORDER — AMIODARONE HCL IN DEXTROSE 360-4.14 MG/200ML-% IV SOLN
INTRAVENOUS | Status: AC
  Filled 2018-07-01: qty 200

## 2018-07-01 MED ORDER — SODIUM BICARBONATE 8.4 % IV SOLN
50.0000 meq | Freq: Once | INTRAVENOUS | Status: AC
  Administered 2018-07-01: 50 meq via INTRAVENOUS

## 2018-07-01 MED ORDER — INSULIN DETEMIR 100 UNIT/ML ~~LOC~~ SOLN
20.0000 [IU] | Freq: Two times a day (BID) | SUBCUTANEOUS | Status: DC
  Administered 2018-07-02 (×2): 20 [IU] via SUBCUTANEOUS
  Filled 2018-07-01 (×5): qty 0.2

## 2018-07-01 MED ORDER — METOCLOPRAMIDE HCL 5 MG/ML IJ SOLN
10.0000 mg | Freq: Four times a day (QID) | INTRAMUSCULAR | Status: DC
  Administered 2018-07-01 (×3): 10 mg via INTRAVENOUS
  Filled 2018-07-01 (×2): qty 2

## 2018-07-01 MED ORDER — CALCIUM GLUCONATE-NACL 1-0.675 GM/50ML-% IV SOLN
1.0000 g | Freq: Once | INTRAVENOUS | Status: AC
  Administered 2018-07-02: 1000 mg via INTRAVENOUS
  Filled 2018-07-01: qty 50

## 2018-07-01 MED ORDER — SODIUM BICARBONATE 8.4 % IV SOLN
INTRAVENOUS | Status: AC
  Filled 2018-07-01: qty 50

## 2018-07-01 MED ORDER — AMIODARONE HCL IN DEXTROSE 360-4.14 MG/200ML-% IV SOLN
60.0000 mg/h | INTRAVENOUS | Status: AC
  Administered 2018-07-01: 60 mg/h via INTRAVENOUS

## 2018-07-01 MED ORDER — MIDAZOLAM HCL 2 MG/2ML IJ SOLN
2.0000 mg | Freq: Once | INTRAMUSCULAR | Status: AC
  Administered 2018-07-01: 2 mg via INTRAVENOUS

## 2018-07-01 MED ORDER — INSULIN ASPART 100 UNIT/ML ~~LOC~~ SOLN
0.0000 [IU] | SUBCUTANEOUS | Status: DC
  Administered 2018-07-02 (×2): 4 [IU] via SUBCUTANEOUS
  Administered 2018-07-02: 2 [IU] via SUBCUTANEOUS

## 2018-07-01 MED FILL — Potassium Chloride Inj 2 mEq/ML: INTRAVENOUS | Qty: 40 | Status: AC

## 2018-07-01 MED FILL — Magnesium Sulfate Inj 50%: INTRAMUSCULAR | Qty: 10 | Status: AC

## 2018-07-01 MED FILL — Heparin Sodium (Porcine) Inj 1000 Unit/ML: INTRAMUSCULAR | Qty: 30 | Status: AC

## 2018-07-01 NOTE — Progress Notes (Signed)
Patient got a -28 NIF and VC 1.4L. Patient had good effort. Patient had a + cuff leak.

## 2018-07-01 NOTE — Anesthesia Procedure Notes (Signed)
Procedure Name: Intubation Date/Time: 07/01/2018 8:16 PM Performed by: Nolon Nations, MD Pre-anesthesia Checklist: Emergency Drugs available, Patient identified, Suction available, Patient being monitored and Timeout performed Patient Re-evaluated:Patient Re-evaluated prior to induction Oxygen Delivery Method: Ambu bag Preoxygenation: Pre-oxygenation with 100% oxygen Ventilation: Mask ventilation without difficulty Laryngoscope Size: Mac and 4 Grade View: Grade I Tube type: Oral Tube size: 7.5 mm Number of attempts: 1 Placement Confirmation: CO2 detector,  ETT inserted through vocal cords under direct vision and breath sounds checked- equal and bilateral Secured at: 22 cm Tube secured with: Tape Dental Injury: Teeth and Oropharynx as per pre-operative assessment

## 2018-07-01 NOTE — Progress Notes (Signed)
  Echocardiogram 2D Echocardiogram has been performed.  Michael Hess 07/01/2018, 9:31 PM

## 2018-07-01 NOTE — Progress Notes (Signed)
1 Day Post-Op Procedure(s) (LRB): CORONARY ARTERY BYPASS GRAFTING (CABG) (N/A) TRANSESOPHAGEAL ECHOCARDIOGRAM (TEE) (N/A) ENDOVEIN HARVEST OF GREATER SAPHENOUS VEIN (Right) Subjective: C/o pain  Objective: Vital signs in last 24 hours: Temp:  [95.4 F (35.2 C)-100 F (37.8 C)] 100 F (37.8 C) (12/27 0600) Pulse Rate:  [104-124] 108 (12/27 0600) Cardiac Rhythm: Sinus tachycardia (12/27 0000) Resp:  [12-34] 34 (12/27 0600) BP: (74-126)/(52-94) 83/60 (12/27 0600) SpO2:  [95 %-100 %] 100 % (12/27 0600) Arterial Line BP: (79-156)/(45-89) 79/45 (12/27 0600) FiO2 (%):  [40 %-50 %] 40 % (12/27 0149) Weight:  [73 kg] 73 kg (12/27 0500)  Hemodynamic parameters for last 24 hours: PAP: (32-45)/(15-24) 34/16 CO:  [2.7 L/min-3.7 L/min] 3.3 L/min CI:  [1.6 L/min/m2-2.2 L/min/m2] 2 L/min/m2  Intake/Output from previous day: 12/26 0701 - 12/27 0700 In: 5977.7 [I.V.:4222.9; Blood:363.5; IV Piggyback:1391.4] Out: 2625 [Urine:1845; Emesis/NG output:30; Chest Tube:750] Intake/Output this shift: Total I/O In: 139.7 [I.V.:68.4; IV Piggyback:71.3] Out: 80 [Urine:40; Chest Tube:40]  General appearance: alert, cooperative and no distress Neurologic: intact Heart: tachy, regular Lungs: diminished breath sounds bibasilar Abdomen: distended, tympanitic, hypoactive BS  Lab Results: Recent Labs    09-Jun-2018 1940  09-Jun-2018 2200 07/01/18 0410  WBC 10.4  --   --  9.9  HGB 6.3*   < > 7.6* 7.4*  HCT 20.7*   < > 24.7* 24.0*  PLT 157  --   --  138*   < > = values in this interval not displayed.   BMET:  Recent Labs    09-Jun-2018 0254  09-Jun-2018 1947 09-Jun-2018 1955 07/01/18 0410  NA 138   < > 144  --  145  K 4.0   < > 3.9  --  3.2*  CL 109   < > 107  --  110  CO2 22  --   --   --  23  GLUCOSE 115*   < > 153*  --  121*  BUN 17   < > 15  --  18  CREATININE 1.28*   < > 1.30* 1.45* 1.67*  CALCIUM 8.5*  --   --   --  7.0*   < > = values in this interval not displayed.    PT/INR:  Recent Labs     09-Jun-2018 1400  LABPROT 22.6*  INR 2.02   ABG    Component Value Date/Time   PHART 7.321 (L) 07/01/2018 0211   HCO3 19.1 (L) 07/01/2018 0211   TCO2 20 (L) 07/01/2018 0211   ACIDBASEDEF 6.0 (H) 07/01/2018 0211   O2SAT 44.2 07/01/2018 0421   CBG (last 3)  Recent Labs    07/01/18 0606 07/01/18 0653 07/01/18 0759  GLUCAP 108* 104* 100*    Assessment/Plan: S/P Procedure(s) (LRB): CORONARY ARTERY BYPASS GRAFTING (CABG) (N/A) TRANSESOPHAGEAL ECHOCARDIOGRAM (TEE) (N/A) ENDOVEIN HARVEST OF GREATER SAPHENOUS VEIN (Right) -CV- s/p MI, s/p CABG. Low Co on arrival to OR- index 0.8  Index satisfactory on milrinone, epi and norepi  Decrease milrinone to 0.25, wean norepi as BP allows  ASA, statin, beta blocker RESP_ IS for atelectasis RENAL- creatinine elevated at 1.67 c/w acute kidney injury- 1.29 at baseline  Stage III CKD per BMET done in 2016  Lasix drip for diuresis ENDO- CBG well controlled with relatively high dose insulin drip- currently on 5 units/ hr Gi- gastric dilatation, sips only do not advance until bowel function improves, will give Reglan Anemia- will transfuse 1 unit PRBC DC chest tubes Mobilize as tolerated   LOS: 2  days    Michael Hess 07/01/2018

## 2018-07-01 NOTE — Progress Notes (Signed)
Patient ID: Michael PhenixManherlal Hess, male   DOB: 1950-09-07, 67 y.o.   MRN: [id number] TCTS Evening Rounds  He remains hemodynamically marginal this pm. On milrinone 0.25, epi 6, levophed has been on 10 but increased recently to 40 after amio started. Developed faster heart rate in the 140's and appears to be in atrial fib. Started on amiodarone.  Hgb 8.2 this pm so will give him a unit of PRBC's  Abdomen remains distended and no BS consistent with ileus. NG inserted. KUB to check placement pending.  Urine output 25-30 on lasix 4mg /hr.  Creat stable at 1.90.  Ionized calcium 0.95. Will give him some.

## 2018-07-01 NOTE — Procedures (Signed)
Extubation Procedure Note  Patient Details:   Name: Michael Hess DOB: 05-Apr-1951 MRN: [id number]   Airway Documentation:  Airway 8 mm (Active)  Secured at (cm) 24 cm 06/09/2018 10:54 PM  Measured From Lips 06/25/2018 10:54 PM  Secured Location Left 06/25/2018 10:54 PM  Secured By Caron PresumePink Tape 07/05/2018 10:54 PM  Cuff Pressure (cm H2O) 28 cm H2O 06/08/2018  7:33 PM  Site Condition Dry 06/28/2018 10:54 PM   Vent end date: (not recorded) Vent end time: (not recorded)   Evaluation  O2 sats: stable throughout Complications: No apparent complications Patient did tolerate procedure well. Bilateral Breath Sounds: Rhonchi, Diminished   Yes   Patient extubated to 4 L [place] at 0235 with no complications   Benson SettingMorgan R Osby Sweetin 07/01/2018, 2:41 AM

## 2018-07-01 NOTE — Progress Notes (Signed)
Prelim stat echo results (limited TTE)  LVEF ~40-45%, no significant pericardial effusion or evidence of tamponade.  Full report to follow  Precious ReelStephanie Falk Martin, MD , [facility] 07/01/18 10:17 PM

## 2018-07-01 NOTE — CV Procedure (Signed)
Insertion of left femoral arterial line and right subclavian central line: Informed consent from family, sterile prep and drape, time out taken. 1% lidocaine local anesthesia.  Left femoral arterial line inserted using ultrasound guidance and Seldinger technique. BP significantly higher than that recorded by radial arterial line. 120/70. Will wean levophed as tolerated.  Right subclavian triple lumen catheter inserted using Seldinger technique for venous access.  CXR pending.

## 2018-07-01 NOTE — Progress Notes (Signed)
Patient ID: Michael Hess, male   DOB: [birth year], 67 y.o.   MRN: [id number] TCTS Code note:  The patient developed progressive deterioration in hemodynamics and oxygen sats this evening after being seen earlier. Checked ABG and it showed ph 7.32, PCO2 30, PO2 57, sats 88%. Before we could even increase his FiO2 he developed agonal respirations and lost his BP. Code was called, CPR begun, intubated by anesthesia and patient given total of 5 amps of epi, multiple amp of bicarb. He maintained a supraventricular rhythm in the 90's but no BP without CPR consistent with PEA arrest. He had breath sounds bilaterally and no suspicion of ptx or tamponade. After prolonged CPR he slowly developed a BP on his own although he has remained hypotensive with SBP 70's on levophed 80, epi 6, milrinone 0.25. Last ABG shows persistent metabolic acidosis with BE -9 and normalized PCO2. sats still low at 86% despite 100% FiO2. Bedside 2D echo done and I have reviewed. The LVEF is 40% which is unchanged from prior. RV function is normal and the RV does not appear dilated making significant PE less likely. There is no pericardial effusion and no signs of tamponade. Limited view of valves looks ok. It is not clear to me exactly why he decompensated but I suspect it was primarily respiratory as a result of and combined with intraabdominal process such as gut ischemia with postop ileus and persistent metabolic acidosis, possibly with some aspiration, sepsis. His abdomen is distended and tight. He is not stable enough to go for a CT scan and certainly not stable enough for any surgical exploration. I think that all we can do is support him, start broad spectrum antibiotic and see if he stabilizes. I discussed all of this with his family including his critical condition and guarded prognosis.

## 2018-07-01 NOTE — Anesthesia Postprocedure Evaluation (Signed)
Anesthesia Post Note  Patient: Rena Menees  Procedure(s) Performed: CORONARY ARTERY BYPASS GRAFTING (CABG) (N/A Chest) TRANSESOPHAGEAL ECHOCARDIOGRAM (TEE) (N/A ) ENDOVEIN HARVEST OF GREATER SAPHENOUS VEIN (Right Leg Lower)     Patient location during evaluation: SICU Anesthesia Type: General Level of consciousness: sedated, patient cooperative and oriented Pain management: pain level controlled Vital Signs Assessment: post-procedure vital signs reviewed and stable Respiratory status: spontaneous breathing, nonlabored ventilation, patient connected to nasal cannula oxygen and respiratory function stable Cardiovascular status: stable (still requiring inotropic support. Weaning epi and norepi infusions.) Postop Assessment: no apparent nausea or vomiting Anesthetic complications: no    Last Vitals:  Vitals:   07/01/18 1609 07/01/18 1615  BP:  96/64  Pulse:  (!) 108  Resp:  14  Temp: 36.7 C   SpO2:  96%    Last Pain:  Vitals:   07/01/18 1609  TempSrc: Oral  PainSc:                  Marty Uy,E. Yessika Otte

## 2018-07-02 ENCOUNTER — Other Ambulatory Visit: Payer: Self-pay

## 2018-07-02 ENCOUNTER — Inpatient Hospital Stay (HOSPITAL_COMMUNITY): Payer: Medicare Other

## 2018-07-02 DIAGNOSIS — N19 Unspecified kidney failure: Secondary | ICD-10-CM

## 2018-07-02 DIAGNOSIS — J9601 Acute respiratory failure with hypoxia: Secondary | ICD-10-CM

## 2018-07-02 LAB — CBC
HCT: 31.6 % — ABNORMAL LOW (ref 39.0–52.0)
HCT: 32.7 % — ABNORMAL LOW (ref 39.0–52.0)
HEMOGLOBIN: 10.5 g/dL — AB (ref 13.0–17.0)
Hemoglobin: 10.1 g/dL — ABNORMAL LOW (ref 13.0–17.0)
MCH: 25.1 pg — ABNORMAL LOW (ref 26.0–34.0)
MCH: 25.5 pg — ABNORMAL LOW (ref 26.0–34.0)
MCHC: 32 g/dL (ref 30.0–36.0)
MCHC: 32.1 g/dL (ref 30.0–36.0)
MCV: 78.4 fL — AB (ref 80.0–100.0)
MCV: 79.4 fL — ABNORMAL LOW (ref 80.0–100.0)
Platelets: 104 10*3/uL — ABNORMAL LOW (ref 150–400)
Platelets: 111 10*3/uL — ABNORMAL LOW (ref 150–400)
RBC: 4.03 MIL/uL — ABNORMAL LOW (ref 4.22–5.81)
RBC: 4.12 MIL/uL — ABNORMAL LOW (ref 4.22–5.81)
RDW: 18 % — ABNORMAL HIGH (ref 11.5–15.5)
RDW: 17.9 % — ABNORMAL HIGH (ref 11.5–15.5)
WBC: 13.3 10*3/uL — ABNORMAL HIGH (ref 4.0–10.5)
WBC: 15.3 10*3/uL — AB (ref 4.0–10.5)
nRBC: 0.5 % — ABNORMAL HIGH (ref 0.0–0.2)
nRBC: 0.7 % — ABNORMAL HIGH (ref 0.0–0.2)

## 2018-07-02 LAB — POCT I-STAT 3, ART BLOOD GAS (G3+)
ACID-BASE DEFICIT: 5 mmol/L — AB (ref 0.0–2.0)
Acid-Base Excess: 1 mmol/L (ref 0.0–2.0)
Acid-base deficit: 8 mmol/L — ABNORMAL HIGH (ref 0.0–2.0)
Acid-base deficit: 5 mmol/L — ABNORMAL HIGH (ref 0.0–2.0)
BICARBONATE: 26.1 mmol/L (ref 20.0–28.0)
Bicarbonate: 21 mmol/L (ref 20.0–28.0)
Bicarbonate: 18.1 mmol/L — ABNORMAL LOW (ref 20.0–28.0)
Bicarbonate: 19.8 mmol/L — ABNORMAL LOW (ref 20.0–28.0)
O2 SAT: 94 %
O2 SAT: 95 %
O2 Saturation: 92 %
O2 Saturation: 89 %
Patient temperature: 97.7
Patient temperature: 97.6
Patient temperature: 35.6
Patient temperature: 37.8
TCO2: 22 mmol/L (ref 22–32)
TCO2: 19 mmol/L — AB (ref 22–32)
TCO2: 21 mmol/L — AB (ref 22–32)
TCO2: 27 mmol/L (ref 22–32)
pCO2 arterial: 38.8 mmHg (ref 32.0–48.0)
pCO2 arterial: 35.6 mmHg (ref 32.0–48.0)
pCO2 arterial: 34.3 mmHg (ref 32.0–48.0)
pCO2 arterial: 43.2 mmHg (ref 32.0–48.0)
pH, Arterial: 7.338 — ABNORMAL LOW (ref 7.350–7.450)
pH, Arterial: 7.312 — ABNORMAL LOW (ref 7.350–7.450)
pH, Arterial: 7.363 (ref 7.350–7.450)
pH, Arterial: 7.393 (ref 7.350–7.450)
pO2, Arterial: 71 mmHg — ABNORMAL LOW (ref 83.0–108.0)
pO2, Arterial: 81 mmHg — ABNORMAL LOW (ref 83.0–108.0)
pO2, Arterial: 60 mmHg — ABNORMAL LOW (ref 83.0–108.0)
pO2, Arterial: 59 mmHg — ABNORMAL LOW (ref 83.0–108.0)

## 2018-07-02 LAB — BPAM RBC
Blood Product Expiration Date: 202001012359
Blood Product Expiration Date: 202001172359
Blood Product Expiration Date: 202001172359
ISSUE DATE / TIME: 201912262022
ISSUE DATE / TIME: 201912270908
ISSUE DATE / TIME: 201912271854
Unit Type and Rh: 5100
Unit Type and Rh: 5100
Unit Type and Rh: 5100

## 2018-07-02 LAB — RENAL FUNCTION PANEL
Albumin: 2.9 g/dL — ABNORMAL LOW (ref 3.5–5.0)
Anion gap: 20 — ABNORMAL HIGH (ref 5–15)
BUN: 25 mg/dL — ABNORMAL HIGH (ref 8–23)
CHLORIDE: 102 mmol/L (ref 98–111)
CO2: 21 mmol/L — ABNORMAL LOW (ref 22–32)
CREATININE: 3.2 mg/dL — AB (ref 0.61–1.24)
Calcium: 6.5 mg/dL — ABNORMAL LOW (ref 8.9–10.3)
GFR calc Af Amer: 22 mL/min — ABNORMAL LOW (ref >60)
GFR, EST NON AFRICAN AMERICAN: 19 mL/min — AB (ref >60)
Glucose, Bld: 129 mg/dL — ABNORMAL HIGH (ref 70–99)
Phosphorus: 2.7 mg/dL (ref 2.5–4.6)
Potassium: 4.3 mmol/L (ref 3.5–5.1)
Sodium: 143 mmol/L (ref 135–145)

## 2018-07-02 LAB — BASIC METABOLIC PANEL
Anion gap: 31 — ABNORMAL HIGH (ref 5–15)
Anion gap: 26 — ABNORMAL HIGH (ref 5–15)
Anion gap: 20 — ABNORMAL HIGH (ref 5–15)
BUN: 25 mg/dL — AB (ref 8–23)
BUN: 27 mg/dL — AB (ref 8–23)
BUN: 20 mg/dL (ref 8–23)
CHLORIDE: 103 mmol/L (ref 98–111)
CO2: 16 mmol/L — ABNORMAL LOW (ref 22–32)
CO2: 20 mmol/L — ABNORMAL LOW (ref 22–32)
CO2: 17 mmol/L — AB (ref 22–32)
Calcium: 6.5 mg/dL — ABNORMAL LOW (ref 8.9–10.3)
Calcium: 6.3 mg/dL — CL (ref 8.9–10.3)
Calcium: 6.5 mg/dL — ABNORMAL LOW (ref 8.9–10.3)
Chloride: 101 mmol/L (ref 98–111)
Chloride: 100 mmol/L (ref 98–111)
Creatinine, Ser: 3.06 mg/dL — ABNORMAL HIGH (ref 0.61–1.24)
Creatinine, Ser: 3.36 mg/dL — ABNORMAL HIGH (ref 0.61–1.24)
Creatinine, Ser: 2.68 mg/dL — ABNORMAL HIGH (ref 0.61–1.24)
GFR calc Af Amer: 23 mL/min — ABNORMAL LOW (ref >60)
GFR calc Af Amer: 21 mL/min — ABNORMAL LOW (ref >60)
GFR calc Af Amer: 27 mL/min — ABNORMAL LOW (ref >60)
GFR calc non Af Amer: 20 mL/min — ABNORMAL LOW (ref >60)
GFR calc non Af Amer: 18 mL/min — ABNORMAL LOW (ref >60)
GFR calc non Af Amer: 23 mL/min — ABNORMAL LOW (ref >60)
Glucose, Bld: 168 mg/dL — ABNORMAL HIGH (ref 70–99)
Glucose, Bld: 183 mg/dL — ABNORMAL HIGH (ref 70–99)
Glucose, Bld: 71 mg/dL (ref 70–99)
POTASSIUM: 3.8 mmol/L (ref 3.5–5.1)
Potassium: 4 mmol/L (ref 3.5–5.1)
Potassium: 4.8 mmol/L (ref 3.5–5.1)
Sodium: 148 mmol/L — ABNORMAL HIGH (ref 135–145)
Sodium: 146 mmol/L — ABNORMAL HIGH (ref 135–145)
Sodium: 140 mmol/L (ref 135–145)

## 2018-07-02 LAB — COOXEMETRY PANEL
Carboxyhemoglobin: 0.7 % (ref 0.5–1.5)
Methemoglobin: 1.9 % — ABNORMAL HIGH (ref 0.0–1.5)
O2 SAT: 51.7 %
Total hemoglobin: 10 g/dL — ABNORMAL LOW (ref 12.0–16.0)

## 2018-07-02 LAB — RESPIRATORY PANEL BY PCR
Adenovirus: NOT DETECTED
Bordetella pertussis: NOT DETECTED
Chlamydophila pneumoniae: NOT DETECTED
Coronavirus 229E: NOT DETECTED
Coronavirus HKU1: NOT DETECTED
Coronavirus NL63: NOT DETECTED
Coronavirus OC43: NOT DETECTED
Influenza A: NOT DETECTED
Influenza B: NOT DETECTED
Metapneumovirus: NOT DETECTED
Mycoplasma pneumoniae: NOT DETECTED
Parainfluenza Virus 1: NOT DETECTED
Parainfluenza Virus 2: NOT DETECTED
Parainfluenza Virus 3: NOT DETECTED
Parainfluenza Virus 4: NOT DETECTED
RESPIRATORY SYNCYTIAL VIRUS-RVPPCR: NOT DETECTED
Rhinovirus / Enterovirus: NOT DETECTED

## 2018-07-02 LAB — GLUCOSE, CAPILLARY
GLUCOSE-CAPILLARY: 173 mg/dL — AB (ref 70–99)
Glucose-Capillary: 156 mg/dL — ABNORMAL HIGH (ref 70–99)
Glucose-Capillary: 164 mg/dL — ABNORMAL HIGH (ref 70–99)
Glucose-Capillary: 164 mg/dL — ABNORMAL HIGH (ref 70–99)
Glucose-Capillary: 131 mg/dL — ABNORMAL HIGH (ref 70–99)

## 2018-07-02 LAB — LACTIC ACID, PLASMA
Lactic Acid, Venous: 15.2 mmol/L (ref 0.5–1.9)
Lactic Acid, Venous: 11.1 mmol/L (ref 0.5–1.9)

## 2018-07-02 LAB — TYPE AND SCREEN
ABO/RH(D): O POS
Antibody Screen: NEGATIVE
UNIT DIVISION: 0
Unit division: 0
Unit division: 0

## 2018-07-02 LAB — LIPASE, BLOOD: Lipase: 20 U/L (ref 11–51)

## 2018-07-02 LAB — PROCALCITONIN: Procalcitonin: 7.27 ng/mL

## 2018-07-02 LAB — STREP PNEUMONIAE URINARY ANTIGEN: Strep Pneumo Urinary Antigen: NEGATIVE

## 2018-07-02 LAB — MAGNESIUM: Magnesium: 2.7 mg/dL — ABNORMAL HIGH (ref 1.7–2.4)

## 2018-07-02 MED ORDER — FENTANYL BOLUS VIA INFUSION
25.0000 ug | INTRAVENOUS | Status: DC | PRN
  Filled 2018-07-02: qty 25

## 2018-07-02 MED ORDER — HEPARIN SODIUM (PORCINE) 1000 UNIT/ML DIALYSIS
1000.0000 [IU] | INTRAMUSCULAR | Status: DC | PRN
  Filled 2018-07-02: qty 6

## 2018-07-02 MED ORDER — SODIUM BICARBONATE 8.4 % IV SOLN
100.0000 meq | Freq: Once | INTRAVENOUS | Status: AC
  Administered 2018-07-02: 100 meq via INTRAVENOUS

## 2018-07-02 MED ORDER — PRISMASOL BGK 4/2.5 32-4-2.5 MEQ/L IV SOLN
INTRAVENOUS | Status: DC
  Administered 2018-07-02 – 2018-07-03 (×7): via INTRAVENOUS_CENTRAL
  Filled 2018-07-02 (×17): qty 5000

## 2018-07-02 MED ORDER — SODIUM BICARBONATE 8.4 % IV SOLN
200.0000 meq | Freq: Once | INTRAVENOUS | Status: AC
  Administered 2018-07-02: 200 meq via INTRAVENOUS

## 2018-07-02 MED ORDER — FENTANYL CITRATE (PF) 100 MCG/2ML IJ SOLN
50.0000 ug | Freq: Once | INTRAMUSCULAR | Status: AC
  Administered 2018-07-02: 50 ug via INTRAVENOUS
  Filled 2018-07-02: qty 2

## 2018-07-02 MED ORDER — ETOMIDATE 2 MG/ML IV SOLN
INTRAVENOUS | Status: AC
  Administered 2018-07-02: 20 mg via INTRAVENOUS
  Filled 2018-07-02: qty 10

## 2018-07-02 MED ORDER — ORAL CARE MOUTH RINSE
15.0000 mL | OROMUCOSAL | Status: DC
  Administered 2018-07-02 – 2018-07-03 (×11): 15 mL via OROMUCOSAL

## 2018-07-02 MED ORDER — CALCIUM GLUCONATE-NACL 1-0.675 GM/50ML-% IV SOLN
1.0000 g | Freq: Once | INTRAVENOUS | Status: AC
  Administered 2018-07-02: 1000 mg via INTRAVENOUS
  Filled 2018-07-02: qty 50

## 2018-07-02 MED ORDER — INSULIN ASPART 100 UNIT/ML ~~LOC~~ SOLN
0.0000 [IU] | SUBCUTANEOUS | Status: DC
  Administered 2018-07-02: 2 [IU] via SUBCUTANEOUS

## 2018-07-02 MED ORDER — SODIUM CHLORIDE 0.9 % IV SOLN: INTRAVENOUS | Status: DC | PRN

## 2018-07-02 MED ORDER — INSULIN ASPART 100 UNIT/ML ~~LOC~~ SOLN
1.0000 [IU] | SUBCUTANEOUS | Status: DC
  Administered 2018-07-02: 2 [IU] via SUBCUTANEOUS

## 2018-07-02 MED ORDER — VANCOMYCIN HCL 10 G IV SOLR
1250.0000 mg | Freq: Once | INTRAVENOUS | Status: AC
  Administered 2018-07-02: 1250 mg via INTRAVENOUS
  Filled 2018-07-02: qty 1250

## 2018-07-02 MED ORDER — VASOPRESSIN 20 UNIT/ML IV SOLN
0.0400 [IU]/min | INTRAVENOUS | Status: DC
  Administered 2018-07-02 – 2018-07-03 (×2): 0.03 [IU]/min via INTRAVENOUS
  Filled 2018-07-02 (×3): qty 2

## 2018-07-02 MED ORDER — PRISMASOL BGK 4/2.5 32-4-2.5 MEQ/L REPLACEMENT SOLN
Status: DC
  Administered 2018-07-02 – 2018-07-03 (×2): via INTRAVENOUS_CENTRAL
  Filled 2018-07-02 (×2): qty 5000

## 2018-07-02 MED ORDER — SODIUM CHLORIDE 0.9 % IV SOLN
2.0000 g | Freq: Two times a day (BID) | INTRAVENOUS | Status: DC
  Administered 2018-07-02 – 2018-07-03 (×2): 2 g via INTRAVENOUS
  Filled 2018-07-02 (×3): qty 2

## 2018-07-02 MED ORDER — DEXTROSE 50 % IV SOLN
12.5000 g | INTRAVENOUS | Status: AC
  Administered 2018-07-02: 12.5 g via INTRAVENOUS
  Filled 2018-07-02: qty 50

## 2018-07-02 MED ORDER — SODIUM BICARBONATE 8.4 % IV SOLN
INTRAVENOUS | Status: AC
  Filled 2018-07-02: qty 50

## 2018-07-02 MED ORDER — PRISMASOL BGK 4/2.5 32-4-2.5 MEQ/L REPLACEMENT SOLN
Status: DC
  Administered 2018-07-02 – 2018-07-03 (×2): via INTRAVENOUS_CENTRAL
  Filled 2018-07-02 (×3): qty 5000

## 2018-07-02 MED ORDER — FENTANYL 2500MCG IN NS 250ML (10MCG/ML) PREMIX INFUSION
25.0000 ug/h | INTRAVENOUS | Status: DC
  Administered 2018-07-02: 25 ug/h via INTRAVENOUS
  Filled 2018-07-02: qty 250

## 2018-07-02 MED ORDER — MIDAZOLAM HCL 2 MG/2ML IJ SOLN
1.0000 mg | INTRAMUSCULAR | Status: DC | PRN
  Administered 2018-07-02 (×2): 2 mg via INTRAVENOUS
  Filled 2018-07-02 (×2): qty 2

## 2018-07-02 MED ORDER — CHLORHEXIDINE GLUCONATE 0.12% ORAL RINSE (MEDLINE KIT)
15.0000 mL | Freq: Two times a day (BID) | OROMUCOSAL | Status: DC
  Administered 2018-07-02 – 2018-07-03 (×3): 15 mL via OROMUCOSAL

## 2018-07-02 MED ORDER — VANCOMYCIN HCL IN DEXTROSE 750-5 MG/150ML-% IV SOLN
750.0000 mg | INTRAVENOUS | Status: DC
  Filled 2018-07-02: qty 150

## 2018-07-02 MED ORDER — ETOMIDATE 2 MG/ML IV SOLN
20.0000 mg | Freq: Once | INTRAVENOUS | Status: AC
  Administered 2018-07-02: 20 mg via INTRAVENOUS

## 2018-07-02 NOTE — Plan of Care (Signed)
  Problem: Education: Goal: Knowledge of General Education information will improve Description Including pain rating scale, medication(s)/side effects and non-pharmacologic comfort measures Outcome: Not Progressing   Problem: Health Behavior/Discharge Planning: Goal: Ability to manage health-related needs will improve Outcome: Not Progressing   Problem: Clinical Measurements: Goal: Ability to maintain clinical measurements within normal limits will improve Outcome: Not Progressing   Problem: Activity: Goal: Risk for activity intolerance will decrease Outcome: Not Progressing   Problem: Nutrition: Goal: Adequate nutrition will be maintained Outcome: Not Progressing   Problem: Coping: Goal: Level of anxiety will decrease Outcome: Not Progressing   Problem: Elimination: Goal: Will not experience complications related to bowel motility Outcome: Not Progressing

## 2018-07-02 NOTE — Progress Notes (Addendum)
Notified MD of critical Lactic acid- 11.1, also d/c orders for droplet precautions r/t negative respiratory panel.   @2300 - Pt CBG 63- given 12.5g  50% Dextrose per protocol- CBG rechecked 15 min later- CBG 139.  @0200 - Pt has not been able to tolerate  fluid removal r/t hypotension. Pt fluid removal rate currently 0.  @0400 - Pt CBG 49- Pt given another 12.5 g D 50% per protocol. E lijnk notified of hypoglycemia and ABG results.

## 2018-07-02 NOTE — Progress Notes (Signed)
CRITICAL VALUE ALERT  Critical Value:  Calcium 6.3  Date & Time Notied:  07/02/18  Provider Notified: Dr. Malen GauzeFoster (Nephrology)  Orders Received/Actions taken: Orders placed for Calcium Gluconate

## 2018-07-02 NOTE — Progress Notes (Signed)
Initial Nutrition Assessment  DOCUMENTATION CODES:   Not applicable  INTERVENTION:    Rec Vital AF 1.2 at goal rate of 50 ml/h (1200 ml per day) and Prostat 30 ml daily   Provide 1540 kcals, 105 gm protein, 973 ml free water daily  NUTRITION DIAGNOSIS:   Inadequate oral intake related to inability to eat as evidenced by NPO status  GOAL:   Patient will meet greater than or equal to 90% of their needs  MONITOR:   Vent status, TF tolerance, Labs, Skin, Weight trends, I & O's  REASON FOR ASSESSMENT:   Consult Enteral/tube feeding initiation and management  ASSESSMENT:   67 yo Male with PMH of MI with 2 stent placements and angina 15 years ago.  Has chronic hyperlipidemia and hypertension.  Presented on June 29, 2018 with a few week history of anginal pain and code STEMI was activated.   Pt s/p procedure 12/26: CORONARY BYPASS GRAFTING x 4  Patient is currently intubated on ventilator support MV: 8.7 L/min Temp (24hrs), Avg:96.7 F (35.9 C), Min:96.1 F (35.6 C), Max:98.2 F (36.8 C)  OGT in place  RD unable to obtain nutrition hx at this time. No nutrition problems identified PTA.  Pt went into atrial fibrillation; s/p prolonged PEA arrest 12/27. He also developed abdominal distention consistent with ileus. Distention resolved after OGT placement per Dr. Laneta SimmersBartle.  Acute kidney injury; starting CRRT this afternoon. Medications include Precedex and pressors. Labs reviewed. Mg 2.7 (H). Na 148 (H). CBG's [phone].  Spoke with Jess, RN; not sure if plan is to start TF today.  NUTRITION - FOCUSED PHYSICAL EXAM:    Most Recent Value  Orbital Region  No depletion  Upper Arm Region  No depletion  Thoracic and Lumbar Region  Unable to assess  Buccal Region  No depletion  Temple Region  No depletion  Clavicle Bone Region  No depletion  Clavicle and Acromion Bone Region  No depletion  Scapular Bone Region  Unable to assess  Dorsal Hand  Unable to assess   Patellar Region  No depletion  Anterior Thigh Region  No depletion  Posterior Calf Region  No depletion  Edema (RD Assessment)  None     Diet Order:   Diet Order            Diet NPO time specified  Diet effective now             EDUCATION NEEDS:   Not appropriate for education at this time  Skin:  Skin Assessment: Skin Integrity Issues: Skin Integrity Issues:: Incisions Incisions: chest  Last BM:  PTA  Height:   Ht Readings from Last 1 Encounters:  06/27/2018 5\' 2"  (1.575 m)   Weight:   Wt Readings from Last 1 Encounters:  07/02/18 74.4 kg  Admit wt          62.4 kg  Ideal Body Weight:  53.6 kg  BMI:  Body mass index is 30 kg/m. >> highly skewed  Estimated Nutritional Needs (using admit weight):  Kcal:  1460  Protein:  95-110 gm  Fluid:  per MD  Maureen ChattersKatie Muadh Creasy, RD, LDN Pager #: [phone] After-Hours Pager #: [phone]

## 2018-07-02 NOTE — Progress Notes (Signed)
MD ordered to keep fluid removal at 840mL/hr for 6 hrs. 6hrs up at 2200, will begin to titrate fluid removal at tollerated to keep patient Net Even.    Michael CarrowKaitlyn E Prabhjot Hess

## 2018-07-02 NOTE — Progress Notes (Signed)
2 Days Post-Op Procedure(s) (LRB): CORONARY ARTERY BYPASS GRAFTING (CABG) (N/A) TRANSESOPHAGEAL ECHOCARDIOGRAM (TEE) (N/A) ENDOVEIN HARVEST OF GREATER SAPHENOUS VEIN (Right) Subjective: Sedated on vent Remains on Milrinone 0.25, epi 6, NE 50 mcg, and Vaso 0.03 to support BP.  Co-ox 52% this am but arterial sat 95%.  Remains on vent on 100% FiO2, PEEP increased to 8 since sats still 89%.   Procalcitonin 7.27 and Lactic acid 15.2. Base deficit on ABG improved to +1 this afternoon from -9 last night.  Consulted CCM and nephrology this am. Patient is anuric and now on CRRT.  Objective: Vital signs in last 24 hours: Temp:  [96.1 F (35.6 C)-100.8 F (38.2 C)] 99.7 F (37.6 C) (12/28 1700) Pulse Rate:  [91-165] 99 (12/28 1700) Cardiac Rhythm: Normal sinus rhythm (12/28 1700) Resp:  [3-32] 20 (12/28 1700) BP: (67-109)/(36-84) 92/66 (12/28 1700) SpO2:  [42 %-100 %] 92 % (12/28 1700) Arterial Line BP: (32-130)/(28-66) 120/55 (12/28 1700) FiO2 (%):  [100 %] 100 % (12/28 1548) Weight:  [74.4 kg] 74.4 kg (12/28 0500)  Hemodynamic parameters for last 24 hours: CVP:  [15 mmHg] 15 mmHg  Intake/Output from previous day: 12/27 0701 - 12/28 0700 In: 3779.6 [I.V.:2330.7; Blood:1003; IV Piggyback:445.9] Out: 770 [Urine:400; Chest Tube:370] Intake/Output this shift: Total I/O In: 2472.5 [I.V.:2322.5; IV Piggyback:150] Out: 7 [Other:7]  General appearance: sedated on vent Neurologic: moving all extremities and following some commands according to nurses Heart: regular rate and rhythm, S1, S2 normal, no murmur, click, rub or gallop Lungs: coarse Abdomen: no BS, mildly distended but soft. Extremities: anasarca Wound: dressing dry  Lab Results: Recent Labs    07/02/18 0114 07/02/18 0420  WBC 13.3* 15.3*  HGB 10.1* 10.5*  HCT 31.6* 32.7*  PLT 104* 111*   BMET:  Recent Labs    07/02/18 0420 07/02/18 1201  NA 148* 146*  K 3.8 4.0  CL 101 100  CO2 16* 20*  GLUCOSE 168* 183*   BUN 25* 27*  CREATININE 3.06* 3.36*  CALCIUM 6.5* 6.3*    PT/INR:  Recent Labs    06/26/2018 1400  LABPROT 22.6*  INR 2.02   ABG    Component Value Date/Time   PHART 7.393 07/02/2018 1415   HCO3 26.1 07/02/2018 1415   TCO2 27 07/02/2018 1415   ACIDBASEDEF 5.0 (H) 07/02/2018 0820   O2SAT 89.0 07/02/2018 1415   CBG (last 3)  Recent Labs    07/02/18 0812 07/02/18 1203 07/02/18 1743  GLUCAP 164* 173* 131*   CLINICAL DATA:  Ileus. Distal supine abdominal image acquired first, then pt started having difficulty so the nurse raised him to a semi-erect position. Proximal abdominal film is semi-erect.  EXAM: ABDOMEN - 1 VIEW  COMPARISON:  Chest x-ray on 07/02/2018  FINDINGS: Central air filled bowel loops raise the question of ascites. Paucity of colonic gas. There is dilatation of small bowel in the LEFT central abdomen raising the question of small bowel obstruction. A nasogastric tube is in place, tip overlying the region of the stomach. Diffuse body wall edema noted.  IMPRESSION: 1. Dilated small bowel loops raising the question of small bowel obstruction. 2. Question of ascites. 3. Consider further evaluation CT of the abdomen and pelvis.   Electronically Signed   By: Norva PavlovElizabeth  Brown M.D.   On: 07/02/2018 11:15  CLINICAL DATA:  Postop day 3 CABG. Cardiac arrest (PEA) with acidosis and progressive renal failure and hypotension. Ventilator dependent respiratory failure requiring 100% oxygen.  EXAM: PORTABLE CHEST 1 VIEW 1:13  p.m.:  COMPARISON:  Portable chest x-ray earlier same day 6:01 a.m. and previously.  FINDINGS: Endotracheal tube tip approximately 2-3 cm above the carina. RIGHT jugular central venous catheter tip at or near the cavoatrial junction, unchanged. RIGHT subclavian central venous catheter tip at or near the cavoatrial junction, unchanged. Nasogastric tube looped in the stomach with its tip in the antrum, unchanged. LEFT  chest tube in place with no pneumothorax. External pacing pads.  Sternotomy for CABG. Cardiac silhouette moderately to markedly enlarged. Mild diffuse interstitial pulmonary edema, unchanged since earlier in the day, allowing for differences in radiographic technique. Stable dense atelectasis involving the LEFT LOWER LOBE. No new abnormalities.  IMPRESSION: 1. Support apparatus satisfactory and unchanged. 2. Stable mild diffuse interstitial pulmonary edema. 3. Stable dense atelectasis involving the LEFT LOWER LOBE. 4. No new abnormalities.   Electronically Signed   By: Hulan Saashomas  Lawrence M.D.   On: 07/02/2018 13:36   Assessment/Plan: S/P Procedure(s) (LRB): CORONARY ARTERY BYPASS GRAFTING (CABG) (N/A) TRANSESOPHAGEAL ECHOCARDIOGRAM (TEE) (N/A) ENDOVEIN HARVEST OF GREATER SAPHENOUS VEIN (Right)  POD 2  CV: Remains on high dose vasopressors and inotropes to support BP after PEA arrest last night. EF 40% by echo last night after arrest which is about the same as immediately postop. RV ok. No tamponade or RV dilation. Co-ox only 52 this am but probably due to lower arterial sat. He appears septic with elevated PCT, lactic acidosis and vasodilation. Antibiotics broadened to Maxepime.  Resp: VDRF with acute hypoxemic respiratory failure. CXR does not look that bad for his oxygen requirement. He could have aspirated yesterday with ileus and gastric distension.  Acute renal failure: secondary to preexisting CKD, pump run, periop hypotension and cardiac arrest. CRRT going per nephrology.  GI: ileus vs SBO. I was concerned about ischemic gut last night but his base deficit is improved and I don't think it would have corrected with ischemic gut. Continue OG tube, NPO. Not stable enough for any further tests and would not be a candidate for surgical treatment at this time even if needed.  ID: concern for possible sepsis. On Maxipime. BC pending.  Neuro: he has been following some  commands per nurses and moving all ext. Observe.  LOS: 3 days    Alleen BorneBryan K Shaunika Italiano 07/02/2018

## 2018-07-02 NOTE — Progress Notes (Signed)
Lab review  - severe hypoxemia  , CXR ALI v CHF - > will increase PEEP   - PCT - high - will broaden abx   - lactate very high - poor prognosis . Recheck at 7pm  - Ileus - lactate 20, KUB with SBO =- > NPO. Continue NG suction. Took sick for general surgery  - renal - start CRRT   SIGNATURE    Dr. Kalman ShanMurali Ilaria Much, M.D., F.C.C.P,  Pulmonary and Critical Care Medicine Staff Physician, [facility] Center Director - Interstitial Lung Disease  Program  Pulmonary Fibrosis [facility] [place], [place], [id number]  Pager: [phone], If no answer or between  15:00h - 7:00h: call [phone] Telephone: [phone]  2:40 PM 07/02/2018   LABS    PULMONARY Recent Labs  Lab 07/01/18 2315 07/02/18 0126 07/02/18 0452 07/02/18 0557 07/02/18 0820 07/02/18 1415  PHART 7.276* 7.338* 7.312*  --  7.363 7.393  PCO2ART 39.7 38.8 35.6  --  34.3 43.2  PO2ART 62.0* 71.0* 81.0*  --  60.0* 59.0*  HCO3 18.6* 21.0 18.1*  --  19.8* 26.1  TCO2 20* 22 19*  --  21* 27  O2SAT 89.0 94.0 95.0 51.7 92.0 89.0    CBC Recent Labs  Lab 07/01/18 1705  07/01/18 2033 07/02/18 0114 07/02/18 0420  HGB 8.2*   < > 7.5* 10.1* 10.5*  HCT 26.1*   < > 22.0* 31.6* 32.7*  WBC 15.3*  --   --  13.3* 15.3*  PLT PLATELET CLUMPS NOTED ON SMEAR, UNABLE TO ESTIMATE  --   --  104* 111*   < > = values in this interval not displayed.    COAGULATION Recent Labs  Lab 04/03/18 2024 06/20/2018 1400  INR 1.07 2.02    CARDIAC   Recent Labs  Lab 04/03/18 1637 04/03/18 2024 06/27/2018 0254  TROPONINI 0.11* 3.98* 41.24*   No results for input(s): PROBNP in the last 168 hours.   CHEMISTRY Recent Labs  Lab 07/04/2018 0254  06/05/2018 1955 07/01/18 0410  07/01/18 1705 07/01/18 1707 07/01/18 2029 07/01/18 2033 07/02/18 0420 07/02/18 1201  NA 138   < >  --  145   < >  --  140 149* 146* 148* 146*  K 4.0   < >  --  3.2*   < >  --  3.9 4.9 4.8 3.8 4.0  CL 109    < >  --  110  --   --  106 103 101 101 100  CO2 22  --   --  23  --   --   --  21*  --  16* 20*  GLUCOSE 115*   < >  --  121*   < >  --  133* 161* 149* 168* 183*  BUN 17   < >  --  18  --   --  21 20 27* 25* 27*  CREATININE 1.28*   < > 1.45* 1.67*  --  1.99* 1.90* 2.55* 2.20* 3.06* 3.36*  CALCIUM 8.5*  --   --  7.0*  --   --   --  6.5*  --  6.5* 6.3*  MG  --   --  3.4* 2.9*  --  2.8*  --  3.1*  --  2.7*  --    < > = values in this interval not displayed.   Estimated Creatinine Clearance: 18.6 mL/min (A) (by C-G  formula based on SCr of 3.36 mg/dL (H)).   LIVER Recent Labs  Lab 07/08/2018 2024 06/12/2018 0254 07/04/2018 1400  AST 54* 125*  --   ALT 16 24  --   ALKPHOS 52 39  --   BILITOT 0.7 0.9  --   PROT 7.0 6.2*  --   ALBUMIN 3.6 3.3*  --   INR 1.07  --  2.02     INFECTIOUS Recent Labs  Lab 07/02/18 1052 07/02/18 1055  LATICACIDVEN 15.2*  --   PROCALCITON  --  7.27     ENDOCRINE CBG (last 3)  Recent Labs    07/02/18 0123 07/02/18 0422 07/02/18 0812  GLUCAP 156* 164* 164*         IMAGING x48h  - image(s) personally visualized  -   highlighted in bold Dg Abd 1 View  Result Date: 07/02/2018 CLINICAL DATA:  Ileus. Distal supine abdominal image acquired first, then pt started having difficulty so the nurse raised him to a semi-erect position. Proximal abdominal film is semi-erect. EXAM: ABDOMEN - 1 VIEW COMPARISON:  Chest x-ray on 07/02/2018 FINDINGS: Central air filled bowel loops raise the question of ascites. Paucity of colonic gas. There is dilatation of small bowel in the LEFT central abdomen raising the question of small bowel obstruction. A nasogastric tube is in place, tip overlying the region of the stomach. Diffuse body wall edema noted. IMPRESSION: 1. Dilated small bowel loops raising the question of small bowel obstruction. 2. Question of ascites. 3. Consider further evaluation CT of the abdomen and pelvis. Electronically Signed   By: Norva Pavlov M.D.    On: 07/02/2018 11:15   Dg Chest Port 1 View  Result Date: 07/02/2018 CLINICAL DATA:  Postop day 3 CABG. Cardiac arrest (PEA) with acidosis and progressive renal failure and hypotension. Ventilator dependent respiratory failure requiring 100% oxygen. EXAM: PORTABLE CHEST 1 VIEW 1:13 p.m.: COMPARISON:  Portable chest x-ray earlier same day 6:01 a.m. and previously. FINDINGS: Endotracheal tube tip approximately 2-3 cm above the carina. RIGHT jugular central venous catheter tip at or near the cavoatrial junction, unchanged. RIGHT subclavian central venous catheter tip at or near the cavoatrial junction, unchanged. Nasogastric tube looped in the stomach with its tip in the antrum, unchanged. LEFT chest tube in place with no pneumothorax. External pacing pads. Sternotomy for CABG. Cardiac silhouette moderately to markedly enlarged. Mild diffuse interstitial pulmonary edema, unchanged since earlier in the day, allowing for differences in radiographic technique. Stable dense atelectasis involving the LEFT LOWER LOBE. No new abnormalities. IMPRESSION: 1. Support apparatus satisfactory and unchanged. 2. Stable mild diffuse interstitial pulmonary edema. 3. Stable dense atelectasis involving the LEFT LOWER LOBE. 4. No new abnormalities. Electronically Signed   By: Hulan Saas M.D.   On: 07/02/2018 13:36   Dg Chest Port 1 View  Result Date: 07/02/2018 CLINICAL DATA:  Status post CABG EXAM: PORTABLE CHEST 1 VIEW COMPARISON:  Yesterday FINDINGS: Endotracheal tube tip with tip 3 cm above the carina. The orogastric tube reaches the stomach. Right subclavian and IJ lines with tips at the SVC and upper cavoatrial junction. Stable cardiomegaly.  Status post CABG. Continued retrocardiac opacification. Posterior left first rib fracture. IMPRESSION: Stable hardware positioning and atelectasis. Posterior left first rib fracture. Electronically Signed   By: Marnee Spring M.D.   On: 07/02/2018 08:23   Dg Chest Port 1  View  Result Date: 07/01/2018 CLINICAL DATA:  Central venous line placement. EXAM: PORTABLE CHEST 1 VIEW COMPARISON:  July 01, 2018  7:39 p.m. FINDINGS: The heart size and mediastinal contours are stable. The heart size is enlarged. Right central venous line is identified with distal tip in the superior vena cava/right atrial junction. Right jugular vascular sheath is identified distal tip in the superior vena cava. Endotracheal tube is identified distal tip 3.2 cm from carina. Nasogastric tube is identified with distal tip not included on film but probably at least in the stomach. Patchy consolidation of left lung base, right upper lobe and right lung base are identified. The visualized skeletal structures are unremarkable. IMPRESSION: Right subclavian central venous line distal tip in the superior vena cava/right atrial junction. There is no pneumothorax. Worsened patchy consolidation of the right lung. Persistent consolidation of left lung base. Electronically Signed   By: Sherian ReinWei-Chen  Lin M.D.   On: 07/01/2018 23:42   Dg Chest Port 1 View  Result Date: 07/01/2018 CLINICAL DATA:  CPR, respiratory failure. EXAM: PORTABLE CHEST 1 VIEW COMPARISON:  July 01, 2018 6:16 a.m. FINDINGS: The heart size and mediastinal contours are stable. Endotracheal tube is identified distal tip 2.3 cm from carina. Retraction by 1.5 cm is recommended. A right jugular vascular sheath is noted with distal tip in the superior vena cava. There is mild central pulmonary vascular congestion. Patchy consolidation of the medial left lung base is unchanged. There is no pneumothorax. The visualized skeletal structures are stable. IMPRESSION: Endotracheal tube is identified with distal tip 2.3 cm from carina. Retraction by 1.5 cm is recommended. Right jugular vascular sheath is noted with distal tip in the superior vena cava. Mild central pulmonary vascular congestion. These results will be called to the ordering clinician or  representative by the Radiologist Assistant, and communication documented in the PACS or zVision Dashboard. Electronically Signed   By: Sherian ReinWei-Chen  Lin M.D.   On: 07/01/2018 20:20   Dg Chest Port 1 View  Result Date: 07/01/2018 CLINICAL DATA:  Status post CABG, mi EXAM: PORTABLE CHEST 1 VIEW COMPARISON:  06/25/2018 FINDINGS: Interval extubation and removal of enteric tube. Low lung volumes. Left basilar opacity, likely atelectasis. No pleural effusions. Left chest tube and mediastinal drain.  No pneumothorax is seen. Mild cardiomegaly.  Postsurgical changes related to prior CABG. Right IJ Swan-Ganz catheter with its tip in the right main pulmonary artery. Median sternotomy. IMPRESSION: Interval extubation and removal of enteric tube. Left chest tube and mediastinal drain.  No pneumothorax is seen. Additional postsurgical changes related to prior CABG, as above. Electronically Signed   By: Charline BillsSriyesh  Krishnan M.D.   On: 07/01/2018 06:37

## 2018-07-02 NOTE — Progress Notes (Signed)
Critical Lactic Acid resulted at 10:50 am - RN reported it to the MD.  Lab called at 5:22 pm to report this value to the RN.

## 2018-07-02 NOTE — Procedures (Signed)
Seen on CVVHD.  BP 110/51 and MAP 64.  Currently set at 0 ml/hr fluid removal with plans for gradual increase to keep even as tolerated after 6 hours.  Patient's levophed has been decreased.  Remains on epi and vaso and milrinone.  Spoke with RN and son at bedside.   Vallery SaLori Foster, MD

## 2018-07-02 NOTE — Consult Note (Addendum)
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  [place] from [place].  Lives in the [place] area with the son for the last 6 years.  History of MI with 2 stent placements and angina 15 years ago.  Has chronic hyperlipidemia and hypertension.  Presented on June 29, 2018 with a few week history of anginal pain and code STEMI was activated.  EF 35% on admission .  On June 30, 2018 he underwent CABG x4 and extubated early hours of July 01, 2018.  On morning rounds July 01, 2018: Noted to have mild acute kidney injury early hours of 08/02/18 Lasix drip was started.  1 unit of blood was also given.  He was already on milrinone.  With some Levophed but this was being weaned.  Chest tubes were discontinued.  He was found to have some gastric distention and his food intake was limited to sips.  Later in the evening he developed atrial fibrillation continued pressor needs, worsening acute kidney injury and subsequently developed prolonged PEA arrest with CPR.  Had ROSC  and was apparently was able to follow commands but currently is on the ventilator on 100% oxygen with PEEP 5 has profound circulatory failure with multiple pressor needs and inotropes and Lasix drip but has acute renal failure with a creatinine of 3.06 and anuric and hypothermic post arrest.  Critical care medicine called for consultation June 24, 2018.   Noted: Post cardiac arrest echo according to Dr. Mohammed Kindle ejection fraction 40% and unchanged from 3 cardiac arrest.  RV function is normal.  No pericardial effusion.  No tamponade.  Valves all appeared okay.  Gastric distention improved after placement of an OG tube  Past Medical History     has a past  medical history of CAD in native artery (06/05/2018), H/O angioplasty, Hypercholesteremia, Hypertension, Ischemic cardiomyopathy (06/13/2018), and MI (myocardial infarction) ([facility]).   has no history on file for tobacco.   The histories are not reviewed yet. Please review them in the "History" navigator section and refresh this [place].  Allergies  Allergen Reactions  . Fruit & Vegetable Daily [Nutritional Supplements]     Cherries   . Penicillins      There is no immunization history on file for this patient.  No family history on file.   Current Facility-Administered Medications:  .  0.45 % sodium chloride infusion, , Intravenous, Continuous PRN, Antony Odea, PA-C, Stopped at 06/08/2018 2009 .  0.9 %  sodium chloride infusion (Manually program via Guardrails IV Fluids), , Intravenous, Once, Bryson Corona, CRNA .  0.9 %  sodium chloride infusion (Manually program via Guardrails IV Fluids), , Intravenous, Once, Melrose Nakayama, MD .  0.9 %  sodium chloride infusion (Manually program via Guardrails IV Fluids), , Intravenous, Once, Gaye Pollack, MD .  0.9 %  sodium chloride infusion, 250 mL, Intravenous, Continuous, Roddenberry, Myron G, PA-C, Last Rate: 1 mL/hr at 07/01/18 0558, 250 mL at 07/01/18 0558 .  0.9 %  sodium chloride infusion, , Intravenous, Continuous, Roddenberry, Arlis Porta, PA-C, Last Rate: 10 mL/hr at 06/16/2018 1359 .  [COMPLETED] amiodarone (NEXTERONE) 1.8 mg/mL load via infusion 150 mg, 150 mg, Intravenous, Once, 150 mg at 07/01/18  1844 **FOLLOWED BY** [EXPIRED] amiodarone (NEXTERONE PREMIX) 360-4.14 MG/200ML-% (1.8 mg/mL) IV infusion, 60 mg/hr, Intravenous, Continuous, Stopped at 07/02/18 0048 **FOLLOWED BY** amiodarone (NEXTERONE PREMIX) 360-4.14 MG/200ML-% (1.8 mg/mL) IV infusion, 30 mg/hr, Intravenous, Continuous, Bartle, Fernande Boyden, MD, Last Rate: 16.67 mL/hr at 07/02/18 0334, 30 mg/hr at 07/02/18 0334 .  aspirin EC tablet 325 mg, 325 mg, Oral, Daily **OR**  aspirin chewable tablet 324 mg, 324 mg, Per Tube, Daily, Roddenberry, Myron G, PA-C .  cefTRIAXone (ROCEPHIN) 2 g in sodium chloride 0.9 % 100 mL IVPB, 2 g, Intravenous, Q24H, Bartle, Fernande Boyden, MD, Stopped at 07/02/18 0224 .  chlorhexidine gluconate (MEDLINE KIT) (PERIDEX) 0.12 % solution 15 mL, 15 mL, Mouth Rinse, BID, Melrose Nakayama, MD, 15 mL at 07/02/18 0800 .  dexmedetomidine (PRECEDEX) 200 MCG/50ML (4 mcg/mL) infusion, 0.4-1.2 mcg/kg/hr, Intravenous, Titrated, Gaye Pollack, MD, Last Rate: 16.43 mL/hr at 07/02/18 0816, 0.9 mcg/kg/hr at 07/02/18 0816 .  enoxaparin (LOVENOX) injection 30 mg, 30 mg, Subcutaneous, QHS, Melrose Nakayama, MD, 30 mg at 07/02/18 0142 .  EPINEPHrine (ADRENALIN) 4 mg in dextrose 5 % 250 mL (0.016 mg/mL) infusion, 6 mcg/min, Intravenous, Continuous, Melrose Nakayama, MD, Last Rate: 22.5 mL/hr at 07/02/18 0300, 6 mcg/min at 07/02/18 0300 .  furosemide (LASIX) 250 mg in dextrose 5 % 250 mL (1 mg/mL) infusion, 4 mg/hr, Intravenous, Continuous, Melrose Nakayama, MD, Last Rate: 4 mL/hr at 07/02/18 0443, 4 mg/hr at 07/02/18 0443 .  CBG monitoring, , , Q4H **AND** insulin aspart (novoLOG) injection 0-24 Units, 0-24 Units, Subcutaneous, Q4H, Melrose Nakayama, MD, 4 Units at 07/02/18 0827 .  insulin detemir (LEVEMIR) injection 20 Units, 20 Units, Subcutaneous, BID, Melrose Nakayama, MD, 20 Units at 07/02/18 0147 .  insulin regular bolus via infusion 0-10 Units, 0-10 Units, Intravenous, TID WC, Roddenberry, Myron G, PA-C .  insulin regular, human (MYXREDLIN) 100 units/ 100 mL infusion, , Intravenous, Continuous, Roddenberry, Myron G, PA-C, Stopped at 07/01/18 1454 .  lactated ringers infusion, , Intravenous, Continuous, Roddenberry, Myron G, PA-C .  lactated ringers infusion, , Intravenous, Continuous, Roddenberry, Myron G, PA-C .  levalbuterol (XOPENEX) nebulizer solution 0.63 mg, 0.63 mg, Nebulization, PRN, Gaye Pollack, MD .  MEDLINE mouth  rinse, 15 mL, Mouth Rinse, 10 times per day, Melrose Nakayama, MD .  milrinone (PRIMACOR) 20 MG/100 ML (0.2 mg/mL) infusion, 0.3 mcg/kg/min, Intravenous, Continuous, Roddenberry, Myron G, PA-C, Last Rate: 4.82 mL/hr at 07/02/18 0300, 0.25 mcg/kg/min at 07/02/18 0300 .  morphine 2 MG/ML injection 1-4 mg, 1-4 mg, Intravenous, Q1H PRN, Roddenberry, Myron G, PA-C, 2 mg at 07/02/18 0441 .  nitroGLYCERIN 50 mg in dextrose 5 % 250 mL (0.2 mg/mL) infusion, 0-100 mcg/min, Intravenous, Titrated, Roddenberry, Myron G, PA-C .  norepinephrine (LEVOPHED) 16 mg in D5W 214m premix infusion, 0-40 mcg/min, Intravenous, Titrated, HMelrose Nakayama MD, Last Rate: 70.3 mL/hr at 07/02/18 0300, 75 mcg/min at 07/02/18 0300 .  ondansetron (ZOFRAN) injection 4 mg, 4 mg, Intravenous, Q6H PRN, Roddenberry, Myron G, PA-C .  oxyCODONE (Oxy IR/ROXICODONE) immediate release tablet 5-10 mg, 5-10 mg, Oral, Q3H PRN, Roddenberry, Myron G, PA-C .  pantoprazole (PROTONIX) injection 40 mg, 40 mg, Intravenous, Q24H, Bartle, BFernande Boyden MD, 40 mg at 07/02/18 0151 .  phenylephrine (NEOSYNEPHRINE) 20-0.9 MG/250ML-% infusion, 0-100 mcg/min, Intravenous, Titrated, Roddenberry, MArlis Porta PA-C, Last Rate: 75 mL/hr at 07/02/18 0836, 100 mcg/min at 07/02/18 0836 .  sodium chloride flush (NS) 0.9 % injection 3 mL, 3 mL, Intravenous, Q12H, Roddenberry, Myron  G, PA-C, 3 mL at 07/01/18 2119 .  sodium chloride flush (NS) 0.9 % injection 3 mL, 3 mL, Intravenous, PRN, Antony Odea, PA-C, 3 mL at 07/02/18 [facility] Events   06/12/2018- admit 12/28 - ccm consult  Consults:  12/28 - ccm 12/28 - renal  Procedures:  12/26 - CABG Radial aline ? Date and side >> 12/27   left femoral aline 12/27>> Rt IJ introducer Rt Subclavian Triple lumen Foley OG tube ETT 12/26 > 12/26, 12/27 2am >> ETT 12/27 late PM >>  Significant Diagnostic Tests:  x  Micro Data:  RVP 12/28 Urine strep 12/28 Urine leg 12/28 Blood  culture 12/28 PCT 12/28  Antimicrobials:   Anti-infectives (From admission, onward)   Start     Dose/Rate Route Frequency Ordered Stop   07/01/18 2330  cefTRIAXone (ROCEPHIN) 2 g in sodium chloride 0.9 % 100 mL IVPB     2 g 200 mL/hr over 30 Minutes Intravenous Every 24 hours 07/01/18 2329     07/01/18 1000  levofloxacin (LEVAQUIN) IVPB 750 mg     750 mg 100 mL/hr over 90 Minutes Intravenous Every 24 hours 06/27/2018 1338 07/01/18 1152   06/08/2018 2030  vancomycin (VANCOCIN) IVPB 1000 mg/200 mL premix     1,000 mg 200 mL/hr over 60 Minutes Intravenous  Once 06/07/2018 1338 06/07/2018 2110   06/13/2018 0400  vancomycin (VANCOCIN) 1,250 mg in sodium chloride 0.9 % 250 mL IVPB     1,250 mg 166.7 mL/hr over 90 Minutes Intravenous To Surgery 07/02/2018 2108 06/05/2018 1019   06/08/2018 0400  levofloxacin (LEVAQUIN) IVPB 500 mg     500 mg 100 mL/hr over 60 Minutes Intravenous To Surgery 06/11/2018 2108 06/11/2018 0919       Interim history/subjective:    12/28 - on precedex, lasix gtt, amio gtt, epi 6, levophed 75, neo 100, and milrinone 0.25. 100% fip2/peep 5. Hypothermic. Bair hugger on  Objective   Blood pressure (!) 77/47, pulse (!) 123, temperature (!) 96.1 F (35.6 C), resp. rate (!) 24, height _0  (1.575 m), weight 74.4 kg, SpO2 94 %. PAP: (34-45)/(14-24) 43/23  Vent Mode: PRVC FiO2 (%):  [100 %] 100 % Set Rate:  [20 bmp] 20 bmp Vt Set:  [440 mL] 440 mL PEEP:  [5 cmH20] 5 cmH20 Plateau Pressure:  [19 cmH20-22 cmH20] 19 cmH20   Intake/Output Summary (Last 24 hours) at 07/02/2018 6203 Last data filed at 07/02/2018 0400 Gross per 24 hour  Intake 3639.91 ml  Output 690 ml  Net 2949.91 ml   Filed Weights   07/05/2018 0500 07/01/18 0500 07/02/18 0500  Weight: 64.2 kg 73 kg 74.4 kg   General Appearance:  Looks criticall ill ++ Head:  Normocephalic, without obvious abnormality, atraumatic Eyes:  PERRL - yes, conjunctiva/corneas - clear     Ears:  Normal external ear canals, both  ears Nose:  G tube - no Throat:  ETT TUBE - yes , OG tube - yes Neck:  Supple,  No enlargement/tenderness/nodules Lungs: Clear to auscultation bilaterally, Ventilator   Synchrony - yes Heart:  S1 and S2 normal, no murmur, CVP - no.  Pressors - yes x multiple Abdomen:  Soft, no masses, no organomegaly Genitalia / Rectal:  Not done Extremities:  Extremities- intact Skin:  ntact in exposed areas . Sacral area - not examined Neurologic:  Sedation - precedex gtt -> RASS - -3 . Moves all 4s - not evaluated. CAM-ICU - not evaluated . Orientation - not  evaluated        LABS    PULMONARY Recent Labs  Lab 07/01/18 2017 07/01/18 2033 07/01/18 2054 07/01/18 2315 07/02/18 0126 07/02/18 0452 07/02/18 0557  PHART 7.191*  --  7.240* 7.276* 7.338* 7.312*  --   PCO2ART 59.7*  --  42.0 39.7 38.8 35.6  --   PO2ART 60.0*  --  58.0* 62.0* 71.0* 81.0*  --   HCO3 23.1  --  18.2* 18.6* 21.0 18.1*  --   TCO2 25 23 19* 20* 22 19*  --   O2SAT 85.0  --  86.0 89.0 94.0 95.0 51.7    CBC Recent Labs  Lab 07/01/18 1705  07/01/18 2033 07/02/18 0114 07/02/18 0420  HGB 8.2*   < > 7.5* 10.1* 10.5*  HCT 26.1*   < > 22.0* 31.6* 32.7*  WBC 15.3*  --   --  13.3* 15.3*  PLT PLATELET CLUMPS NOTED ON SMEAR, UNABLE TO ESTIMATE  --   --  104* 111*   < > = values in this interval not displayed.    COAGULATION Recent Labs  Lab 06/24/2018 2024 06/11/2018 1400  INR 1.07 2.02    CARDIAC   Recent Labs  Lab 06/15/2018 1637 06/26/2018 2024 06/25/2018 0254  TROPONINI 0.11* 3.98* 41.24*   No results for input(s): PROBNP in the last 168 hours.   CHEMISTRY Recent Labs  Lab 06/16/2018 1637  06/05/2018 0254  07/02/2018 1955 07/01/18 0410 07/01/18 1401 07/01/18 1705 07/01/18 1707 07/01/18 2029 07/01/18 2033 07/02/18 0420  NA 139  --  138   < >  --  145 144  --  140 149* 146* 148*  K 4.2  --  4.0   < >  --  3.2* 3.6  --  3.9 4.9 4.8 3.8  CL 106  --  109   < >  --  110  --   --  106 103 101 101  CO2 26  --   22  --   --  23  --   --   --  21*  --  16*  GLUCOSE 111*  --  115*   < >  --  121* 103*  --  133* 161* 149* 168*  BUN 22  --  17   < >  --  18  --   --  21 20 27* 25*  CREATININE 1.29*  --  1.28*   < > 1.45* 1.67*  --  1.99* 1.90* 2.55* 2.20* 3.06*  CALCIUM 9.2  --  8.5*  --   --  7.0*  --   --   --  6.5*  --  6.5*  MG  --    < >  --   --  3.4* 2.9*  --  2.8*  --  3.1*  --  2.7*   < > = values in this interval not displayed.   Estimated Creatinine Clearance: 20.4 mL/min (A) (by C-G formula based on SCr of 3.06 mg/dL (H)).   LIVER Recent Labs  Lab 06/12/2018 2024 06/19/2018 0254 06/09/2018 1400  AST 54* 125*  --   ALT 16 24  --   ALKPHOS 52 39  --   BILITOT 0.7 0.9  --   PROT 7.0 6.2*  --   ALBUMIN 3.6 3.3*  --   INR 1.07  --  2.02     INFECTIOUS No results for input(s): LATICACIDVEN, PROCALCITON in the last 168 hours.   ENDOCRINE CBG (last 3)  Recent Labs    07/02/18 0123 07/02/18 0422 07/02/18 0812  GLUCAP 156* 164* 164*         IMAGING x48h  - image(s) personally visualized  -   highlighted in bold Dg Chest Port 1 View  Result Date: 07/02/2018 CLINICAL DATA:  Status post CABG EXAM: PORTABLE CHEST 1 VIEW COMPARISON:  Yesterday FINDINGS: Endotracheal tube tip with tip 3 cm above the carina. The orogastric tube reaches the stomach. Right subclavian and IJ lines with tips at the SVC and upper cavoatrial junction. Stable cardiomegaly.  Status post CABG. Continued retrocardiac opacification. Posterior left first rib fracture. IMPRESSION: Stable hardware positioning and atelectasis. Posterior left first rib fracture. Electronically Signed   By: Monte Fantasia M.D.   On: 07/02/2018 08:23   Dg Chest Port 1 View  Result Date: 07/01/2018 CLINICAL DATA:  Central venous line placement. EXAM: PORTABLE CHEST 1 VIEW COMPARISON:  July 01, 2018 7:39 p.m. FINDINGS: The heart size and mediastinal contours are stable. The heart size is enlarged. Right central venous line is  identified with distal tip in the superior vena cava/right atrial junction. Right jugular vascular sheath is identified distal tip in the superior vena cava. Endotracheal tube is identified distal tip 3.2 cm from carina. Nasogastric tube is identified with distal tip not included on film but probably at least in the stomach. Patchy consolidation of left lung base, right upper lobe and right lung base are identified. The visualized skeletal structures are unremarkable. IMPRESSION: Right subclavian central venous line distal tip in the superior vena cava/right atrial junction. There is no pneumothorax. Worsened patchy consolidation of the right lung. Persistent consolidation of left lung base. Electronically Signed   By: Abelardo Diesel M.D.   On: 07/01/2018 23:42   Dg Chest Port 1 View  Result Date: 07/01/2018 CLINICAL DATA:  CPR, respiratory failure. EXAM: PORTABLE CHEST 1 VIEW COMPARISON:  July 01, 2018 6:16 a.m. FINDINGS: The heart size and mediastinal contours are stable. Endotracheal tube is identified distal tip 2.3 cm from carina. Retraction by 1.5 cm is recommended. A right jugular vascular sheath is noted with distal tip in the superior vena cava. There is mild central pulmonary vascular congestion. Patchy consolidation of the medial left lung base is unchanged. There is no pneumothorax. The visualized skeletal structures are stable. IMPRESSION: Endotracheal tube is identified with distal tip 2.3 cm from carina. Retraction by 1.5 cm is recommended. Right jugular vascular sheath is noted with distal tip in the superior vena cava. Mild central pulmonary vascular congestion. These results will be called to the ordering clinician or representative by the Radiologist Assistant, and communication documented in the PACS or zVision Dashboard. Electronically Signed   By: Abelardo Diesel M.D.   On: 07/01/2018 20:20   Dg Chest Port 1 View  Result Date: 07/01/2018 CLINICAL DATA:  Status post CABG, mi EXAM:  PORTABLE CHEST 1 VIEW COMPARISON:  06/19/2018 FINDINGS: Interval extubation and removal of enteric tube. Low lung volumes. Left basilar opacity, likely atelectasis. No pleural effusions. Left chest tube and mediastinal drain.  No pneumothorax is seen. Mild cardiomegaly.  Postsurgical changes related to prior CABG. Right IJ Swan-Ganz catheter with its tip in the right main pulmonary artery. Median sternotomy. IMPRESSION: Interval extubation and removal of enteric tube. Left chest tube and mediastinal drain.  No pneumothorax is seen. Additional postsurgical changes related to prior CABG, as above. Electronically Signed   By: Julian Hy M.D.   On: 07/01/2018 06:37   Dg Chest Port 1  View  Result Date: 07/05/2018 CLINICAL DATA:  Post CABG EXAM: PORTABLE CHEST 1 VIEW COMPARISON:  06/11/2018 FINDINGS: Endotracheal tube has been placed and is 2 cm above the carina. Swan-Ganz catheter in the main pulmonary artery. Left chest tube in place, no pneumothorax. NG tube is in the stomach. Changes of CABG. Cardiomegaly with vascular congestion and areas of perihilar and lower lobe atelectasis. IMPRESSION: Postoperative changes with support devices as above. No visible pneumothorax. Vascular congestion with perihilar and lower lobe atelectasis. Electronically Signed   By: Rolm Baptise M.D.   On: 06/23/2018 14:21  ;  Resolved Hospital Problem list   x  Assessment & Plan:  ASSESSMENT / PLAN:  PULMONARY A:  Post arrest, post=op resp failure 07/01/18  07/02/2018 -> does not meet SBT criteria due to severe hypoxemia (ARDS/ALI v chf v both) and circulatory and renal failure  P:   PRVC VAP bundle  CARDIOVASCULAR A:   Known CAD - MI with systolic chf 00/37/0488 ./ sp CAB 06/21/2018 Circulatory failure post CABG as well 07/01/2018 - 07/01/18 PEA of unclear etiology with prolonged CPR 07/01/18  12/28 - on ionotropes and multiple pressors . QTC appears prolonged. Coox 51. CVP  - pending   P:  Get ekg MAP  goal > 60 and sBP > 100 - d/w Cards Stop precedex Stop neo Continue epi, levophed, milrinone AVoid QTc prolongation drugs   RENAL A:  AKI probably even at admit 06/08/2018 and progressively worse  12/28 - anuric. Creat 3. No response to lasix gtt  P:  Renal consult Dr Royce Macadamia called ? Give fluid challenge - check CVP first   ELECTROLYTES A:  At risk for imbalance P: Monitor   GASTROINTESTINAL A:   Ileus post op 07/02/2018 and 07/01/18  12/.28 - abd soft and distension better after OG tube  P:   Check lipase Check kub   HEMATOLOGIC A:  Post op anemia without overt bleeding   P:  - PRBC for hgb </= 8.0gm%  Due to ACS   INFECTIOUS A:   Concern for sepsis P:   Check PCT, lactate, RVP, urine strep, urine legionella Continue ceftriaxone for now - might have to broaden based on results   ENDOCRINE A:   At risk hyperglycemia   P:   ssi  NEUROLOGIC A:   At risk anoxic injury but has followed some commands post arrest 07/01/18 P:   Monitor  GLOBAL Very ill, Poor prognosis   Best practice:  Diet: npo Pain/Anxiety/Delirium protocol (if indicated): change precedex to fent gtt VAP protocol (if indicated): yes DVT prophylaxis: per cvts GI prophylaxis: ppi Glucose control: icu ssi Mobility: bed rest Code Status: full code Family Communication: son Donnamarie Poag (in tears) at bedside updated and nephew Dr Debbe Mounts over phone Disposition: ICU    ATTESTATION & SIGNATURE   The patient Julen Uphoff is critically ill with multiple organ systems failure and requires high complexity decision making for assessment and support, frequent evaluation and titration of therapies, application of advanced monitoring technologies and extensive interpretation of multiple databases.   Critical Care Time devoted to patient care services described in this note is  60  Minutes. This time reflects time of care of this signee Dr Brand Males. This critical care time does  not reflect procedure time, or teaching time or supervisory time of PA/NP/Med student/Med Resident etc but could involve care discussion time     Dr. Brand Males, M.D., [facility].C.P Pulmonary and Critical Care Medicine Staff Physician [place]  [place] [place] [facility] Pager: [phone], If no answer or between  15:00h - 7:00h: call [phone]  07/02/2018 8:53 AM

## 2018-07-02 NOTE — Progress Notes (Signed)
   07/02/18 1100  Clinical Encounter Type  Visited With Family  Visit Type Initial   Intro visit w/ family in 2H waiting after saw tearful family member, also [facility] secretary that family will likely need support today.  Chaplain remains available.  Margretta SidleAndrea M Adira [place] Chaplain resident, [phone]

## 2018-07-02 NOTE — Progress Notes (Signed)
Patient was going down hill when this RN arrived on shift. Patients HR was high, he was profoundly hypotensive and he was experiencing some respiratory distress. Patient was anxious and inconsolable. RN notified Dr. Laneta SimmersBartle to come to bedside. When Dr. Laneta SimmersBartle arrived, EKG was being done and 15L NRB was applied. Patient lost palpable pulse at 1944, CPR started. See code sheet for medication admin times. Multiple ABGs were drawn throughout the shift, and patient was very acidotic due to low bicarb. Multiple amps of bicarb given. Patient stabilized around 0100 and Levophed was begun to titrate down. Patient did not tolerate the titrations and medications had to be returned to their doses. Patient is calm on Precidex, he does wake up occasionally and get anxious and fight the ventilator. O2 sat remains stable and BP is stable with MAP of >70.   Patient got mildy tachycardic around 0230. HR continued to rise as the morning went on. Pain medication and increasing sedation does help a little but HR still remains in the 110-120s. BP is stable. Report given to Aviva SignsJessica Dunn, RN.   Michael CarrowKaitlyn E Rameen Hess

## 2018-07-02 NOTE — Progress Notes (Signed)
ABG results reported to Dr. Marchelle Gearingamaswamy - verbal order given to RN and RT to increase PEEP to 8 and do recruitment. Implemented by RT.

## 2018-07-02 NOTE — Progress Notes (Signed)
Progress Note  Patient Name: Michael Hess Date of Encounter: 07/02/2018  Primary Cardiologist:   Kathlyn Sacramento, MD   Subjective   Intubated and sedated.   Events of last night noted.  PEA arrest with acidosis and progressive renal failure and hypotension.  Did have abdominal distension that improved after NG.   Inpatient Medications    Scheduled Meds: . sodium chloride   Intravenous Once  . sodium chloride   Intravenous Once  . sodium chloride   Intravenous Once  . aspirin EC  325 mg Oral Daily   Or  . aspirin  324 mg Per Tube Daily  . chlorhexidine gluconate (MEDLINE KIT)  15 mL Mouth Rinse BID  . insulin aspart  0-24 Units Subcutaneous Q4H  . insulin detemir  20 Units Subcutaneous BID  . insulin regular  0-10 Units Intravenous TID WC  . mouth rinse  15 mL Mouth Rinse 10 times per day  . pantoprazole (PROTONIX) IV  40 mg Intravenous Q24H  . sodium chloride flush  3 mL Intravenous Q12H   Continuous Infusions: . sodium chloride Stopped (06/13/2018 2009)  . sodium chloride Stopped (07/02/18 0900)  . sodium chloride 10 mL/hr at 06/24/2018 1359  . amiodarone 30 mg/hr (07/02/18 0900)  . cefTRIAXone (ROCEPHIN)  IV Stopped (07/02/18 0224)  . dexmedetomidine (PRECEDEX) IV infusion 0.9 mcg/kg/hr (07/02/18 0900)  . epinephrine 6 mcg/min (07/02/18 0900)  . furosemide (LASIX) infusion 4 mg/hr (07/02/18 0900)  . insulin Stopped (07/01/18 1454)  . lactated ringers    . lactated ringers 10 mL/hr at 07/02/18 0900  . milrinone 0.25 mcg/kg/min (07/02/18 0900)  . nitroGLYCERIN    . norepinephrine (LEVOPHED) Adult infusion 75 mcg/min (07/02/18 0900)  . phenylephrine (NEO-SYNEPHRINE) Adult infusion 100 mcg/min (07/02/18 0900)   PRN Meds: sodium chloride, levalbuterol, morphine injection, ondansetron (ZOFRAN) IV, oxyCODONE, sodium chloride flush   Vital Signs    Vitals:   07/02/18 0800 07/02/18 0815 07/02/18 0830 07/02/18 0845  BP: (!) 82/58     Pulse: (!) 124 (!) 122    Resp: 17  (!) 23 (!) 21 (!) 22  Temp: (!) 96.1 F (35.6 C) (!) 96.1 F (35.6 C) (!) 96.1 F (35.6 C) (!) 96.1 F (35.6 C)  TempSrc: Bladder     SpO2: 95% 95%    Weight:      Height:        Intake/Output Summary (Last 24 hours) at 07/02/2018 0933 Last data filed at 07/02/2018 0900 Gross per 24 hour  Intake 4806.98 ml  Output 590 ml  Net 4216.98 ml   Filed Weights   06/08/2018 0500 07/01/18 0500 07/02/18 0500  Weight: 64.2 kg 73 kg 74.4 kg    Telemetry    Tach with RBBB - Personally Reviewed  ECG    Tachycardia, unclear rhythm, sinus vs accelerated junctional  - Personally Reviewed  Physical Exam   GEN:    Critically ill appearing.  Neck: No  JVD Cardiac: RRR, no murmurs, rubs, or gallops.  Distant heart sounds Respiratory:    Decreased breath sounds  GI: Soft, mildly distended, absent bowel sounds MS:    Trace diffuse edema; No deformity. Neuro:   Sedated, wakes up and becomes combative by report Psych:   Unable to assess.   Labs    Chemistry Recent Labs  Lab 06/21/2018 2024 06/05/2018 0254  07/01/18 0410  07/01/18 1705  07/01/18 2029 07/01/18 2033 07/02/18 0420  NA  --  138   < > 145   < >  --    < >  149* 146* 148*  K  --  4.0   < > 3.2*   < >  --    < > 4.9 4.8 3.8  CL  --  109   < > 110  --   --    < > 103 101 101  CO2  --  22  --  23  --   --   --  21*  --  16*  GLUCOSE  --  115*   < > 121*   < >  --    < > 161* 149* 168*  BUN  --  17   < > 18  --   --    < > 20 27* 25*  CREATININE  --  1.28*   < > 1.67*  --  1.99*   < > 2.55* 2.20* 3.06*  CALCIUM  --  8.5*  --  7.0*  --   --   --  6.5*  --  6.5*  PROT 7.0 6.2*  --   --   --   --   --   --   --   --   ALBUMIN 3.6 3.3*  --   --   --   --   --   --   --   --   AST 54* 125*  --   --   --   --   --   --   --   --   ALT 16 24  --   --   --   --   --   --   --   --   ALKPHOS 52 39  --   --   --   --   --   --   --   --   BILITOT 0.7 0.9  --   --   --   --   --   --   --   --   GFRNONAA  --  57*   < > 41*  --  34*  --   25*  --  20*  GFRAA  --  >60   < > 48*  --  39*  --  29*  --  23*  ANIONGAP  --  7  --  12  --   --   --  25*  --  31*   < > = values in this interval not displayed.     Hematology Recent Labs  Lab 07/01/18 1705  07/01/18 2033 07/02/18 0114 07/02/18 0420  WBC 15.3*  --   --  13.3* 15.3*  RBC 3.49*  --   --  4.03* 4.12*  HGB 8.2*   < > 7.5* 10.1* 10.5*  HCT 26.1*   < > 22.0* 31.6* 32.7*  MCV 74.8*  --   --  78.4* 79.4*  MCH 23.5*  --   --  25.1* 25.5*  MCHC 31.4  --   --  32.0 32.1  RDW 16.9*  --   --  18.0* 17.9*  PLT PLATELET CLUMPS NOTED ON SMEAR, UNABLE TO ESTIMATE  --   --  104* 111*   < > = values in this interval not displayed.    Cardiac Enzymes Recent Labs  Lab 06/05/2018 1637 06/28/2018 2024 06/24/2018 0254  TROPONINI 0.11* 3.98* 41.24*   No results for input(s): TROPIPOC in the last 168 hours.   BNP Recent Labs  Lab 06/06/2018 2008  BNP 399.0*  DDimer No results for input(s): DDIMER in the last 168 hours.   Radiology    Dg Chest Port 1 View  Result Date: 07/02/2018 CLINICAL DATA:  Status post CABG EXAM: PORTABLE CHEST 1 VIEW COMPARISON:  Yesterday FINDINGS: Endotracheal tube tip with tip 3 cm above the carina. The orogastric tube reaches the stomach. Right subclavian and IJ lines with tips at the SVC and upper cavoatrial junction. Stable cardiomegaly.  Status post CABG. Continued retrocardiac opacification. Posterior left first rib fracture. IMPRESSION: Stable hardware positioning and atelectasis. Posterior left first rib fracture. Electronically Signed   By: Monte Fantasia M.D.   On: 07/02/2018 08:23   Dg Chest Port 1 View  Result Date: 07/01/2018 CLINICAL DATA:  Central venous line placement. EXAM: PORTABLE CHEST 1 VIEW COMPARISON:  July 01, 2018 7:39 p.m. FINDINGS: The heart size and mediastinal contours are stable. The heart size is enlarged. Right central venous line is identified with distal tip in the superior vena cava/right atrial junction.  Right jugular vascular sheath is identified distal tip in the superior vena cava. Endotracheal tube is identified distal tip 3.2 cm from carina. Nasogastric tube is identified with distal tip not included on film but probably at least in the stomach. Patchy consolidation of left lung base, right upper lobe and right lung base are identified. The visualized skeletal structures are unremarkable. IMPRESSION: Right subclavian central venous line distal tip in the superior vena cava/right atrial junction. There is no pneumothorax. Worsened patchy consolidation of the right lung. Persistent consolidation of left lung base. Electronically Signed   By: Abelardo Diesel M.D.   On: 07/01/2018 23:42   Dg Chest Port 1 View  Result Date: 07/01/2018 CLINICAL DATA:  CPR, respiratory failure. EXAM: PORTABLE CHEST 1 VIEW COMPARISON:  July 01, 2018 6:16 a.m. FINDINGS: The heart size and mediastinal contours are stable. Endotracheal tube is identified distal tip 2.3 cm from carina. Retraction by 1.5 cm is recommended. A right jugular vascular sheath is noted with distal tip in the superior vena cava. There is mild central pulmonary vascular congestion. Patchy consolidation of the medial left lung base is unchanged. There is no pneumothorax. The visualized skeletal structures are stable. IMPRESSION: Endotracheal tube is identified with distal tip 2.3 cm from carina. Retraction by 1.5 cm is recommended. Right jugular vascular sheath is noted with distal tip in the superior vena cava. Mild central pulmonary vascular congestion. These results will be called to the ordering clinician or representative by the Radiologist Assistant, and communication documented in the PACS or zVision Dashboard. Electronically Signed   By: Abelardo Diesel M.D.   On: 07/01/2018 20:20   Dg Chest Port 1 View  Result Date: 07/01/2018 CLINICAL DATA:  Status post CABG, mi EXAM: PORTABLE CHEST 1 VIEW COMPARISON:  06/15/2018 FINDINGS: Interval extubation and  removal of enteric tube. Low lung volumes. Left basilar opacity, likely atelectasis. No pleural effusions. Left chest tube and mediastinal drain.  No pneumothorax is seen. Mild cardiomegaly.  Postsurgical changes related to prior CABG. Right IJ Swan-Ganz catheter with its tip in the right main pulmonary artery. Median sternotomy. IMPRESSION: Interval extubation and removal of enteric tube. Left chest tube and mediastinal drain.  No pneumothorax is seen. Additional postsurgical changes related to prior CABG, as above. Electronically Signed   By: Julian Hy M.D.   On: 07/01/2018 06:37   Dg Chest Port 1 View  Result Date: 06/12/2018 CLINICAL DATA:  Post CABG EXAM: PORTABLE CHEST 1 VIEW COMPARISON:  06/09/2018 FINDINGS: Endotracheal tube  has been placed and is 2 cm above the carina. Swan-Ganz catheter in the main pulmonary artery. Left chest tube in place, no pneumothorax. NG tube is in the stomach. Changes of CABG. Cardiomegaly with vascular congestion and areas of perihilar and lower lobe atelectasis. IMPRESSION: Postoperative changes with support devices as above. No visible pneumothorax. Vascular congestion with perihilar and lower lobe atelectasis. Electronically Signed   By: Rolm Baptise M.D.   On: 06/19/2018 14:21    Cardiac Studies   ECHO :  Final pending see below.    12/25 Cath   Mid RCA lesion is 100% stenosed.  Dist LM lesion is 40% stenosed.  Ost LAD to Prox LAD lesion is 99% stenosed.  Mid Cx lesion is 99% stenosed.  Ost 1st Diag lesion is 100% stenosed.  Prox Cx lesion is 50% stenosed.  There is mild to moderate left ventricular systolic dysfunction.  The left ventricular ejection fraction is 35-45% by visual estimate.  LV end diastolic pressure is moderately elevated.  Patient Profile     67 y.o. male  with a hx of coronary artery disease status post stent placement x2 in [place] 15 years ago who is being seen for the evaluation of chest pain and abnormal EKG at  the request of Dr. Clearnce Hasten. Found to have CAD as above.   CABG with subsequent multiorgan failure.   Assessment & Plan    CARDIOMYOPATHY:  EF 35% on TEE 12/26.  Appears unchanged on echo done urgently last night.  I reviewed the images.   Continue supportive care. CVP pending  CAD:  CABG x 4.  No evidence of acute ischemia on EKG this AM.  There is a RBBB which was not evident preop.    AKI:  Acute on probable baseline chronic renal insufficiency.  Renal has been consulted.  Anuric.    RESPIRATORY FAILURE:  Intubated and sedated.  Requiring 100% FIO2.  Repeat CXR pending.  Mild edema on  CXR at the time of intubation.   SHOCK:  Requiring multiple pressors per CCM.  Multiorgan system failure.   TACHYCARDIA:  Secondary to acute events rather than primary .  Continuing IV amiodarone for now.  No ventricular arrhythmias noted.   ABDOMINAL DISTENSION:  KUB pending.   For questions or updates, please contact [place] Please consult [email or web address] for contact info under Cardiology/STEMI.   Signed, Minus Breeding, MD  07/02/2018, 9:33 AM

## 2018-07-02 NOTE — Consult Note (Signed)
[facility] Renal Consultation Note  Requesting MD: Ramaswamy Indication for Consultation:  AKI  HPI:  Michael Hess is a 67 y.o. male with a history of coronary artery disease who presented with to the hospital with worsening chest pain on 12/25 and was found to have a STEMI.  He underwent CABG on 12/26 after cath showed severe three vessel disease.  His course has been complicated by PEA arrest with CPR with ROSC.  Per nursing he was pulseless for a short duration (under 4-5 minutes) on two occasions.  He was intubated and has been on multiple pressors - epi vaso-and Levophed as well as milrinone.  He has been on a lasix gtt.  He had 400 mL UOP charted over 12/27.    I spoke with the patient's son at bedside.  He states that he would want any measures that would help his father.  They are specifically in agreement with pursuing CRRT/dialysis.  He denies any known history of NSAID use over the years other than just intermittently.  He states that his dad did have prostate surgery about 6 years ago but is not sure of additional details.  Creatinine  Date/Time Value Ref Range Status  09/29/2014 09:36 AM 1.34 (H) mg/dL Final    Comment:    4.09-8.110.61-1.24 NOTE: New Reference Range  09/11/14   02/26/2013 05:37 AM 1.12 0.60 - 1.30 mg/dL Final  91/47/829508/23/2014 62:1303:48 AM 1.23 0.60 - 1.30 mg/dL Final   Creatinine, Ser  Date/Time Value Ref Range Status  07/02/2018 04:20 AM 3.06 (H) 0.61 - 1.24 mg/dL Final  08/65/784612/27/2019 96:2908:33 PM 2.20 (H) 0.61 - 1.24 mg/dL Final  52/84/132412/27/2019 40:1008:29 PM 2.55 (H) 0.61 - 1.24 mg/dL Final  27/25/366412/27/2019 40:3405:07 PM 1.90 (H) 0.61 - 1.24 mg/dL Final  74/25/956312/27/2019 87:5605:05 PM 1.99 (H) 0.61 - 1.24 mg/dL Final  43/32/951812/27/2019 84:1604:10 AM 1.67 (H) 0.61 - 1.24 mg/dL Final  60/63/016012/26/2019 10:9307:55 PM 1.45 (H) 0.61 - 1.24 mg/dL Final  23/55/732212/26/2019 02:5407:47 PM 1.30 (H) 0.61 - 1.24 mg/dL Final  27/06/237612/26/2019 28:3104:16 PM 1.10 0.61 - 1.24 mg/dL Final  51/76/160712/26/2019 37:1001:00 PM 0.90 0.61 - 1.24 mg/dL Final  62/69/485412/26/2019  62:7012:25 PM 0.90 0.61 - 1.24 mg/dL Final  35/00/938112/26/2019 82:9911:16 AM 0.90 0.61 - 1.24 mg/dL Final  37/16/967812/26/2019 93:8110:10 AM 0.80 0.61 - 1.24 mg/dL Final  01/75/102512/26/2019 85:2709:52 AM 1.00 0.61 - 1.24 mg/dL Final  78/24/235312/26/2019 61:4408:12 AM 1.10 0.61 - 1.24 mg/dL Final  31/54/008612/26/2019 76:1902:54 AM 1.28 (H) 0.61 - 1.24 mg/dL Final  50/93/2671July 14, 2019 24:5804:37 PM 1.29 (H) 0.61 - 1.24 mg/dL Final     PMHx:   Past Medical History:  Diagnosis Date  . CAD in native artery January 16, 2018  . H/O angioplasty   . Hypercholesteremia   . Hypertension   . Ischemic cardiomyopathy January 16, 2018  . MI (myocardial infarction) (HCC)     Family Hx: unable to obtain 2/2 mech ventilation.  Family denies known family history of CKD/ESRD   Social History: unable to obtain 2/2 mech ventilation.  Family denies tob/etoh/drug abuse. Patient is a retired Theatre managergynecologist.  Allergies:  Allergies  Allergen Reactions  . Fruit & Vegetable Daily [Nutritional Supplements]     Cherries   . Penicillins     Medications: Prior to Admission medications   Medication Sig Start Date End Date Taking? Authorizing Provider  aspirin EC 81 MG tablet Take 81 mg by mouth daily.   Yes [provider]  cetirizine (ZYRTEC) 10 MG tablet Take 10 mg by mouth as needed  for allergies.   Yes [provider]  losartan (COZAAR) 25 MG tablet Take 25 mg by mouth 2 (two) times daily. 05/30/18  Yes [provider]  metoprolol succinate (TOPROL-XL) 25 MG 24 hr tablet Take 25 mg by mouth daily. 05/30/18  Yes [provider]  Multiple Vitamins-Minerals (MULTIVITAMIN WITH MINERALS) tablet Take 1 tablet by mouth daily.   Yes [provider]  NON FORMULARY Take 1 capsule by mouth every other day. **NON-US MARKET PRODUCT** Meganeuron (mecobalamin/folic acid/alpha lipoic acid)    [provider]  NON FORMULARY Take 1 capsule by mouth every other day. **NON-US MARKET PRODUCT** Win[employer] (omega-3 Fatty Acids/antioxidants/chromium/vitamins)     [provider]  rosuvastatin (CRESTOR) 5 MG tablet Take 5 mg by mouth daily.    [provider]    I have reviewed the patient's current medications.  Labs:  BMP Latest Ref Rng & Units 07/02/2018 07/01/2018 07/01/2018  Glucose 70 - 99 mg/dL 409(W168(H) 119(J149(H) 478(G161(H)  BUN 8 - 23 mg/dL 95(A25(H) 21(H27(H) 20  Creatinine 0.61 - 1.24 mg/dL 0.86(V3.06(H) 7.84(O2.20(H) 9.62(X2.55(H)  Sodium 135 - 145 mmol/L 148(H) 146(H) 149(H)  Potassium 3.5 - 5.1 mmol/L 3.8 4.8 4.9  Chloride 98 - 111 mmol/L 101 101 103  CO2 22 - 32 mmol/L 16(L) - 21(L)  Calcium 8.9 - 10.3 mg/dL 5.2(W6.5(L) - 6.5(L)    Urinalysis    Component Value Date/Time   COLORURINE YELLOW 06/12/2018 0421   APPEARANCEUR CLEAR 07/01/2018 0421   APPEARANCEUR Clear 09/29/2014 1058   LABSPEC 1.033 (H) 06/15/2018 0421   LABSPEC 1.010 09/29/2014 1058   PHURINE 5.0 06/10/2018 0421   GLUCOSEU NEGATIVE 06/14/2018 0421   GLUCOSEU Negative 09/29/2014 1058   HGBUR NEGATIVE 06/25/2018 0421   BILIRUBINUR NEGATIVE 06/22/2018 0421   BILIRUBINUR Negative 09/29/2014 1058   KETONESUR NEGATIVE 06/10/2018 0421   PROTEINUR NEGATIVE 06/27/2018 0421   NITRITE NEGATIVE 06/24/2018 0421   LEUKOCYTESUR NEGATIVE 06/09/2018 0421   LEUKOCYTESUR Negative 09/29/2014 1058     ROS: Unable to obtain 2/2 AMS   Physical Exam: Vitals:   07/02/18 0930 07/02/18 0945  BP:    Pulse:    Resp: 18 19  Temp: (!) 96.3 F (35.7 C) (!) 96.4 F (35.8 C)  SpO2:       General: Elderly male intubated critically ill HEENT: NCAT; ET tube in place  Heart: Tachycardic; S1-S2 Lungs: Coarse mechanical breath sounds Abdomen: soft/ND Extremities: no pitting edema lower extremities Skin: no rash on extremities exposed Neuro:  Sedation running; not responsive to exam   GU - foley in place Pysch - no agitation; sedation running   Assessment/Plan: # AKI  - Secondary to ischemic ATN as well as pre-renal insults with lasix  - Discontinue lasix gtt  - sending BMP now  - spoke  with family and they have agreed to CRRT.  Spoke with pulmonary/critical care and he is in agreement.  Pulm is able to obtain a line for CRRT later this afternoon.  I anticipate continued worsening of his renal function with ongoing hypotension and pressor requirement and oligoanuria  # s/p PEA arrest  - Supportive care per pulm/critical care and CTS  # Cardiogenic shock - Continue pressors  - Note hx HTN   # Acute hypoxic respiratory failure - On mechanical ventilation   # CAD  - s/p CABG  # Metabolic acidosis, acute - Secondary to AKI  - s/p bicarbonate overnight   # CKD stage III - Noted mild renal impairment at  presentation and in 2016 per labs above  # Anemia  - Acute blood loss/peri-op - Improving s/p PRBC's    Michael Hess 07/02/2018, 11:14 AM

## 2018-07-02 NOTE — Procedures (Signed)
Central Venous Trialysis Catheter Insertion Procedure Note Adam PhenixManherlal Hepworth [id number] 1950-08-12  Procedure: Insertion of Trialysis Central Venous Catheter Indications: Dialysis access  Procedure Details Consent: Risks of procedure as well as the alternatives and risks of each were explained to the (patient/caregiver).  Consent for procedure obtained. Time Out: Verified patient identification, verified procedure, site/side was marked, verified correct patient position, special equipment/implants available, medications/allergies/relevent history reviewed, required imaging and test results available.  Performed  Maximum sterile technique was used including antiseptics, cap, gloves, gown, hand hygiene, mask and sheet. Skin prep: Chlorhexidine; local anesthetic administered A antimicrobial bonded/coated triple lumen catheter was placed in the right internal jugular vein using the Seldinger technique.  Evaluation Blood flow good Complications: No apparent complications Patient did tolerate procedure well. Chest X-ray ordered to verify placement.  CXR: pending.  Renada Cronin 07/02/2018, 1:05 PM

## 2018-07-02 NOTE — Progress Notes (Signed)
Pharmacy Antibiotic Note  Michael Hess is a 67 y.o. male admitted on 07/04/2018 s/p PEA arrest now with concerns for  sepsis. Tmax 99.9, lactic acid 15.2, PCT 7. Pharmacy has been consulted for vancomycin/cefepime dosing. Patient will be starting CRRT shortly.  Plan: Vancomycin 1250 mg x1, then 740 mg IV q24h Cefepime 2 g IV q12h  Height: 5\' 2"  (157.5 cm) Weight: 164 lb 0.4 oz (74.4 kg) IBW/kg (Calculated) : 54.6  Temp (24hrs), Avg:97.8 F (36.6 C), Min:96.1 F (35.6 C), Max:99.9 F (37.7 C)  Recent Labs  Lab 08/23/2017 1940  07/01/18 0410 07/01/18 1705 07/01/18 1707 07/01/18 2029 07/01/18 2033 07/02/18 0114 07/02/18 0420 07/02/18 1052 07/02/18 1201  WBC 10.4  --  9.9 15.3*  --   --   --  13.3* 15.3*  --   --   CREATININE  --    < > 1.67* 1.99* 1.90* 2.55* 2.20*  --  3.06*  --  3.36*  LATICACIDVEN  --   --   --   --   --   --   --   --   --  15.2*  --    < > = values in this interval not displayed.    Estimated Creatinine Clearance: 18.6 mL/min (A) (by C-G formula based on SCr of 3.36 mg/dL (H)).    Allergies  Allergen Reactions  . Fruit & Vegetable Daily [Nutritional Supplements]     Cherries   . Penicillins     Antimicrobials this admission: Ceftriaxone 12/28 >> 12/28 Vancomycin 12/28 >  Cefepime 12/28 >>   Microbiology results: 12/28 Resp Panel : 12/28 BCx: 12/28 mrsa: negative   Thank you for allowing pharmacy to be a part of this patient's care.  Marcelino FreestoneEmily Plato Alspaugh, PharmD PGY2 Cardiology Pharmacy Resident Phone [phone] Please check AMION for all Pharmacist numbers by unit 07/02/2018 3:23 PM

## 2018-07-03 ENCOUNTER — Other Ambulatory Visit: Payer: Self-pay

## 2018-07-03 ENCOUNTER — Inpatient Hospital Stay (HOSPITAL_COMMUNITY): Payer: Medicare Other

## 2018-07-03 ENCOUNTER — Encounter: Payer: Self-pay | Admitting: Cardiovascular Disease

## 2018-07-03 DIAGNOSIS — R579 Shock, unspecified: Secondary | ICD-10-CM

## 2018-07-03 LAB — CBC WITH DIFFERENTIAL/PLATELET
Abs Immature Granulocytes: 0.15 10*3/uL — ABNORMAL HIGH (ref 0.00–0.07)
Abs Immature Granulocytes: 0.1 10*3/uL — ABNORMAL HIGH (ref 0.00–0.07)
Abs Immature Granulocytes: 0.11 10*3/uL — ABNORMAL HIGH (ref 0.00–0.07)
BASOS ABS: 0 10*3/uL (ref 0.0–0.1)
BASOS PCT: 0 %
Basophils Absolute: 0 10*3/uL (ref 0.0–0.1)
Basophils Absolute: 0 10*3/uL (ref 0.0–0.1)
Basophils Relative: 0 %
Basophils Relative: 0 %
Eosinophils Absolute: 0 10*3/uL (ref 0.0–0.5)
Eosinophils Absolute: 0.3 10*3/uL (ref 0.0–0.5)
Eosinophils Absolute: 0 10*3/uL (ref 0.0–0.5)
Eosinophils Relative: 0 %
Eosinophils Relative: 3 %
Eosinophils Relative: 0 %
HCT: 30.7 % — ABNORMAL LOW (ref 39.0–52.0)
HCT: 30.2 % — ABNORMAL LOW (ref 39.0–52.0)
HCT: 25.5 % — ABNORMAL LOW (ref 39.0–52.0)
HEMOGLOBIN: 7.9 g/dL — AB (ref 13.0–17.0)
Hemoglobin: 9.6 g/dL — ABNORMAL LOW (ref 13.0–17.0)
Hemoglobin: 9.1 g/dL — ABNORMAL LOW (ref 13.0–17.0)
Immature Granulocytes: 1 %
Immature Granulocytes: 1 %
Immature Granulocytes: 1 %
Lymphocytes Relative: 5 %
Lymphocytes Relative: 5 %
Lymphocytes Relative: 6 %
Lymphs Abs: 0.7 10*3/uL (ref 0.7–4.0)
Lymphs Abs: 0.5 10*3/uL — ABNORMAL LOW (ref 0.7–4.0)
Lymphs Abs: 0.5 10*3/uL — ABNORMAL LOW (ref 0.7–4.0)
MCH: 24.9 pg — ABNORMAL LOW (ref 26.0–34.0)
MCH: 24.3 pg — ABNORMAL LOW (ref 26.0–34.0)
MCH: 25.7 pg — AB (ref 26.0–34.0)
MCHC: 31.3 g/dL (ref 30.0–36.0)
MCHC: 30.1 g/dL (ref 30.0–36.0)
MCHC: 31 g/dL (ref 30.0–36.0)
MCV: 79.7 fL — ABNORMAL LOW (ref 80.0–100.0)
MCV: 80.7 fL (ref 80.0–100.0)
MCV: 83.1 fL (ref 80.0–100.0)
MONO ABS: 0.3 10*3/uL (ref 0.1–1.0)
MONOS PCT: 2 %
MONOS PCT: 3 %
Monocytes Absolute: 0.2 10*3/uL (ref 0.1–1.0)
Monocytes Absolute: 0.3 10*3/uL (ref 0.1–1.0)
Monocytes Relative: 2 %
NEUTROS ABS: 12.2 10*3/uL — AB (ref 1.7–7.7)
NEUTROS ABS: 8.9 10*3/uL — AB (ref 1.7–7.7)
Neutro Abs: 7.5 10*3/uL (ref 1.7–7.7)
Neutrophils Relative %: 92 %
Neutrophils Relative %: 89 %
Neutrophils Relative %: 90 %
PLATELETS: 73 10*3/uL — AB (ref 150–400)
Platelets: 80 10*3/uL — ABNORMAL LOW (ref 150–400)
Platelets: 53 10*3/uL — ABNORMAL LOW (ref 150–400)
RBC: 3.85 MIL/uL — ABNORMAL LOW (ref 4.22–5.81)
RBC: 3.74 MIL/uL — ABNORMAL LOW (ref 4.22–5.81)
RBC: 3.07 MIL/uL — ABNORMAL LOW (ref 4.22–5.81)
RDW: 18.1 % — ABNORMAL HIGH (ref 11.5–15.5)
RDW: 18.7 % — ABNORMAL HIGH (ref 11.5–15.5)
RDW: 19.1 % — ABNORMAL HIGH (ref 11.5–15.5)
WBC Morphology: INCREASED
WBC Morphology: INCREASED
WBC: 13.4 10*3/uL — ABNORMAL HIGH (ref 4.0–10.5)
WBC: 10 10*3/uL (ref 4.0–10.5)
WBC: 8.5 10*3/uL (ref 4.0–10.5)
nRBC: 3.7 % — ABNORMAL HIGH (ref 0.0–0.2)
nRBC: 6.9 % — ABNORMAL HIGH (ref 0.0–0.2)
nRBC: 11.7 % — ABNORMAL HIGH (ref 0.0–0.2)

## 2018-07-03 LAB — POCT I-STAT 3, ART BLOOD GAS (G3+)
ACID-BASE DEFICIT: 11 mmol/L — AB (ref 0.0–2.0)
Acid-base deficit: 8 mmol/L — ABNORMAL HIGH (ref 0.0–2.0)
Acid-base deficit: 13 mmol/L — ABNORMAL HIGH (ref 0.0–2.0)
Acid-base deficit: 14 mmol/L — ABNORMAL HIGH (ref 0.0–2.0)
Bicarbonate: 18 mmol/L — ABNORMAL LOW (ref 20.0–28.0)
Bicarbonate: 15.3 mmol/L — ABNORMAL LOW (ref 20.0–28.0)
Bicarbonate: 13.7 mmol/L — ABNORMAL LOW (ref 20.0–28.0)
Bicarbonate: 13.3 mmol/L — ABNORMAL LOW (ref 20.0–28.0)
O2 SAT: 91 %
O2 Saturation: 92 %
O2 Saturation: 93 %
O2 Saturation: 86 %
PCO2 ART: 37.7 mmHg (ref 32.0–48.0)
PCO2 ART: 31.3 mmHg — AB (ref 32.0–48.0)
Patient temperature: 36.3
Patient temperature: 35.3
Patient temperature: 34.7
Patient temperature: 33.1
TCO2: 19 mmol/L — ABNORMAL LOW (ref 22–32)
TCO2: 16 mmol/L — ABNORMAL LOW (ref 22–32)
TCO2: 15 mmol/L — ABNORMAL LOW (ref 22–32)
TCO2: 14 mmol/L — ABNORMAL LOW (ref 22–32)
pCO2 arterial: 33.6 mmHg (ref 32.0–48.0)
pCO2 arterial: 29.8 mmHg — ABNORMAL LOW (ref 32.0–48.0)
pH, Arterial: 7.285 — ABNORMAL LOW (ref 7.350–7.450)
pH, Arterial: 7.258 — ABNORMAL LOW (ref 7.350–7.450)
pH, Arterial: 7.237 — ABNORMAL LOW (ref 7.350–7.450)
pH, Arterial: 7.235 — ABNORMAL LOW (ref 7.350–7.450)
pO2, Arterial: 65 mmHg — ABNORMAL LOW (ref 83.0–108.0)
pO2, Arterial: 65 mmHg — ABNORMAL LOW (ref 83.0–108.0)
pO2, Arterial: 68 mmHg — ABNORMAL LOW (ref 83.0–108.0)
pO2, Arterial: 48 mmHg — ABNORMAL LOW (ref 83.0–108.0)

## 2018-07-03 LAB — POCT I-STAT 7, (LYTES, BLD GAS, ICA,H+H)
Acid-base deficit: 14 mmol/L — ABNORMAL HIGH (ref 0.0–2.0)
Bicarbonate: 13 mmol/L — ABNORMAL LOW (ref 20.0–28.0)
Calcium, Ion: 0.85 mmol/L — CL (ref 1.15–1.40)
HCT: 24 % — ABNORMAL LOW (ref 39.0–52.0)
HEMOGLOBIN: 8.2 g/dL — AB (ref 13.0–17.0)
O2 Saturation: 96 %
Patient temperature: 36.1
Potassium: 4.8 mmol/L (ref 3.5–5.1)
Sodium: 135 mmol/L (ref 135–145)
TCO2: 14 mmol/L — ABNORMAL LOW (ref 22–32)
pCO2 arterial: 31.7 mmHg — ABNORMAL LOW (ref 32.0–48.0)
pH, Arterial: 7.218 — ABNORMAL LOW (ref 7.350–7.450)
pO2, Arterial: 90 mmHg (ref 83.0–108.0)

## 2018-07-03 LAB — HEPATIC FUNCTION PANEL
ALT: 3575 U/L — ABNORMAL HIGH (ref 0–44)
AST: 4680 U/L — ABNORMAL HIGH (ref 15–41)
Albumin: 2.7 g/dL — ABNORMAL LOW (ref 3.5–5.0)
Alkaline Phosphatase: 64 U/L (ref 38–126)
Bilirubin, Direct: 1 mg/dL — ABNORMAL HIGH (ref 0.0–0.2)
Indirect Bilirubin: 1 mg/dL — ABNORMAL HIGH (ref 0.3–0.9)
Total Bilirubin: 2 mg/dL — ABNORMAL HIGH (ref 0.3–1.2)
Total Protein: 4.1 g/dL — ABNORMAL LOW (ref 6.5–8.1)

## 2018-07-03 LAB — POCT I-STAT, CHEM 8
BUN: 14 mg/dL (ref 8–23)
Calcium, Ion: 0.83 mmol/L — CL (ref 1.15–1.40)
Chloride: 98 mmol/L (ref 98–111)
Creatinine, Ser: 2 mg/dL — ABNORMAL HIGH (ref 0.61–1.24)
Glucose, Bld: 110 mg/dL — ABNORMAL HIGH (ref 70–99)
HCT: 23 % — ABNORMAL LOW (ref 39.0–52.0)
Hemoglobin: 7.8 g/dL — ABNORMAL LOW (ref 13.0–17.0)
POTASSIUM: 5.1 mmol/L (ref 3.5–5.1)
Sodium: 136 mmol/L (ref 135–145)
TCO2: 18 mmol/L — ABNORMAL LOW (ref 22–32)

## 2018-07-03 LAB — GLUCOSE, CAPILLARY
GLUCOSE-CAPILLARY: 228 mg/dL — AB (ref 70–99)
GLUCOSE-CAPILLARY: 132 mg/dL — AB (ref 70–99)
Glucose-Capillary: 139 mg/dL — ABNORMAL HIGH (ref 70–99)
Glucose-Capillary: 49 mg/dL — ABNORMAL LOW (ref 70–99)
Glucose-Capillary: 63 mg/dL — ABNORMAL LOW (ref 70–99)
Glucose-Capillary: 87 mg/dL (ref 70–99)
Glucose-Capillary: 107 mg/dL — ABNORMAL HIGH (ref 70–99)

## 2018-07-03 LAB — COOXEMETRY PANEL
Carboxyhemoglobin: 0.6 % (ref 0.5–1.5)
Methemoglobin: 2 % — ABNORMAL HIGH (ref 0.0–1.5)
O2 Saturation: 54.4 %
Total hemoglobin: 7.1 g/dL — ABNORMAL LOW (ref 12.0–16.0)

## 2018-07-03 LAB — BASIC METABOLIC PANEL
ANION GAP: 23 — AB (ref 5–15)
BUN: 14 mg/dL (ref 8–23)
CO2: 15 mmol/L — ABNORMAL LOW (ref 22–32)
Calcium: 7.2 mg/dL — ABNORMAL LOW (ref 8.9–10.3)
Chloride: 100 mmol/L (ref 98–111)
Creatinine, Ser: 2.18 mg/dL — ABNORMAL HIGH (ref 0.61–1.24)
GFR calc Af Amer: 35 mL/min — ABNORMAL LOW (ref >60)
GFR calc non Af Amer: 30 mL/min — ABNORMAL LOW (ref >60)
Glucose, Bld: 114 mg/dL — ABNORMAL HIGH (ref 70–99)
POTASSIUM: 5.3 mmol/L — AB (ref 3.5–5.1)
Sodium: 138 mmol/L (ref 135–145)

## 2018-07-03 LAB — RENAL FUNCTION PANEL
Albumin: 2.7 g/dL — ABNORMAL LOW (ref 3.5–5.0)
Anion gap: 20 — ABNORMAL HIGH (ref 5–15)
BUN: 18 mg/dL (ref 8–23)
CO2: 17 mmol/L — ABNORMAL LOW (ref 22–32)
CREATININE: 2.5 mg/dL — AB (ref 0.61–1.24)
Calcium: 6.3 mg/dL — CL (ref 8.9–10.3)
Chloride: 102 mmol/L (ref 98–111)
GFR calc Af Amer: 29 mL/min — ABNORMAL LOW (ref >60)
GFR calc non Af Amer: 25 mL/min — ABNORMAL LOW (ref >60)
GLUCOSE: 57 mg/dL — AB (ref 70–99)
Phosphorus: 4.3 mg/dL (ref 2.5–4.6)
Potassium: 5.3 mmol/L — ABNORMAL HIGH (ref 3.5–5.1)
Sodium: 139 mmol/L (ref 135–145)

## 2018-07-03 LAB — PROCALCITONIN: Procalcitonin: 4.68 ng/mL

## 2018-07-03 LAB — PREPARE RBC (CROSSMATCH)

## 2018-07-03 LAB — ECHOCARDIOGRAM LIMITED
Height: 62 in
Weight: 2574.97 oz

## 2018-07-03 LAB — LEGIONELLA PNEUMOPHILA SEROGP 1 UR AG: L. pneumophila Serogp 1 Ur Ag: NEGATIVE

## 2018-07-03 LAB — LACTIC ACID, PLASMA: LACTIC ACID, VENOUS: 12 mmol/L — AB (ref 0.5–1.9)

## 2018-07-03 LAB — MAGNESIUM: Magnesium: 2.5 mg/dL — ABNORMAL HIGH (ref 1.7–2.4)

## 2018-07-03 MED ORDER — SODIUM BICARBONATE 8.4 % IV SOLN
INTRAVENOUS | Status: AC
  Administered 2018-07-03: 100 meq via INTRAVENOUS
  Filled 2018-07-03: qty 50

## 2018-07-03 MED ORDER — DEXTROSE 50 % IV SOLN
INTRAVENOUS | Status: AC
  Administered 2018-07-03: 25 mL
  Filled 2018-07-03: qty 50

## 2018-07-03 MED ORDER — SODIUM BICARBONATE 8.4 % IV SOLN: 50.0000 meq | Freq: Once | INTRAVENOUS | Status: DC

## 2018-07-03 MED ORDER — DEXTROSE 10 % IV SOLN
INTRAVENOUS | Status: DC
  Administered 2018-07-03: 30 mL/h via INTRAVENOUS

## 2018-07-03 MED ORDER — SODIUM CHLORIDE 0.9% IV SOLUTION: Freq: Once | INTRAVENOUS | Status: AC

## 2018-07-03 MED ORDER — CALCIUM GLUCONATE-NACL 2-0.675 GM/100ML-% IV SOLN
2.0000 g | Freq: Once | INTRAVENOUS | Status: AC
  Administered 2018-07-03 (×2): 2000 mg via INTRAVENOUS
  Filled 2018-07-03: qty 100

## 2018-07-03 MED ORDER — STERILE WATER FOR INJECTION IV SOLN
INTRAVENOUS | Status: DC
  Administered 2018-07-03: 08:00:00 via INTRAVENOUS
  Filled 2018-07-03: qty 850

## 2018-07-03 MED ORDER — SODIUM BICARBONATE 8.4 % IV SOLN
100.0000 meq | Freq: Once | INTRAVENOUS | Status: AC
  Administered 2018-07-03: 100 meq via INTRAVENOUS

## 2018-07-03 MED ORDER — CALCIUM GLUCONATE-NACL 1-0.675 GM/50ML-% IV SOLN
1.0000 g | Freq: Once | INTRAVENOUS | Status: AC
  Administered 2018-07-03: 1000 mg via INTRAVENOUS
  Filled 2018-07-03: qty 50

## 2018-07-03 MED ORDER — SODIUM BICARBONATE 8.4 % IV SOLN
100.0000 meq | Freq: Once | INTRAVENOUS | Status: AC
  Administered 2018-07-03: 100 meq via INTRAVENOUS
  Filled 2018-07-03: qty 50

## 2018-07-03 MED ORDER — SODIUM BICARBONATE 8.4 % IV SOLN
INTRAVENOUS | Status: AC
  Filled 2018-07-03: qty 100

## 2018-07-03 MED ORDER — STERILE WATER FOR INJECTION IV SOLN
INTRAVENOUS | Status: DC
  Filled 2018-07-03: qty 850

## 2018-07-04 LAB — TYPE AND SCREEN
ABO/RH(D): O POS
ANTIBODY SCREEN: NEGATIVE
Unit division: 0

## 2018-07-04 LAB — POCT I-STAT 3, ART BLOOD GAS (G3+)
Acid-base deficit: 13 mmol/L — ABNORMAL HIGH (ref 0.0–2.0)
Bicarbonate: 13.4 mmol/L — ABNORMAL LOW (ref 20.0–28.0)
O2 Saturation: 95 %
Patient temperature: 36.2
TCO2: 14 mmol/L — ABNORMAL LOW (ref 22–32)
pCO2 arterial: 32 mmHg (ref 32.0–48.0)
pH, Arterial: 7.226 — ABNORMAL LOW (ref 7.350–7.450)
pO2, Arterial: 86 mmHg (ref 83.0–108.0)

## 2018-07-04 LAB — PATHOLOGIST SMEAR REVIEW

## 2018-07-04 LAB — BPAM RBC
Blood Product Expiration Date: 202001252359
Unit Type and Rh: 5100

## 2018-07-04 MED FILL — Heparin Sodium (Porcine) Inj 1000 Unit/ML: INTRAMUSCULAR | Qty: 10 | Status: AC

## 2018-07-04 MED FILL — Sodium Bicarbonate IV Soln 8.4%: INTRAVENOUS | Qty: 50 | Status: AC

## 2018-07-04 MED FILL — Electrolyte-R (PH 7.4) Solution: INTRAVENOUS | Qty: 4000 | Status: AC

## 2018-07-04 MED FILL — Mannitol IV Soln 20%: INTRAVENOUS | Qty: 500 | Status: AC

## 2018-07-04 MED FILL — Lidocaine HCl(Cardiac) IV PF Soln Pref Syr 100 MG/5ML (2%): INTRAVENOUS | Qty: 10 | Status: AC

## 2018-07-04 MED FILL — Sodium Chloride IV Soln 0.9%: INTRAVENOUS | Qty: 2000 | Status: AC

## 2018-07-04 MED FILL — Medication: Qty: 1 | Status: AC

## 2018-07-06 NOTE — Progress Notes (Signed)
CDS referral submitted. Patient is not an organ donation candidate. Will call back with TOD to determine eye and tissue donation candidacy.    Referral number: [id number] Spoke with Verlon AuLeslie.

## 2018-07-06 NOTE — Progress Notes (Signed)
Chaplain at bedside to offer support to the family.

## 2018-07-06 NOTE — Progress Notes (Signed)
30 cc Fentanyl wasted with Laverna PeaceShanna Hintz, RN

## 2018-07-06 NOTE — Progress Notes (Signed)
RASS -5 Actively dying at this point - HR 43, MAP 36  D/w son - he said to keep patient well sedated and comfortable. Son and daughter at bedside crying and saying goodbye    SIGNATURE    Dr. Kalman ShanMurali Altamese Deguire, M.D., F.C.C.P,  Pulmonary and Critical Care Medicine Staff Physician, [facility] Center Director - Interstitial Lung Disease  Program  Pulmonary Fibrosis [facility] [place], [place], [id number]  Pager: [phone], If no answer or between  15:00h - 7:00h: call [phone] Telephone: [phone]  12:27 PM 06/24/2018

## 2018-07-06 NOTE — Progress Notes (Addendum)
eLink Physician-Brief Progress Note Patient Name: Michael PhenixManherlal Hess DOB: 04/04/1951 MRN: [id number]   Date of Service  06/30/2018  HPI/Events of Note  ABG on 100%/PRVC 20/TV 440/P 8 = 7.285/37.7/65.0.  eICU Interventions  Will order: 1. Increase PRVC rate to 24. 2. Repeat ABG at 6 AM.      Intervention Category Major Interventions: Acid-Base disturbance - evaluation and management;Respiratory failure - evaluation and management  [name] 06/21/2018, 4:28 AM

## 2018-07-06 NOTE — Progress Notes (Signed)
Patient's core temp 33.0 despite warming blankets - Dr. Marchelle Gearingamaswamy aware.

## 2018-07-06 NOTE — Accreditation Note (Signed)
o Restraints not reported to CMS Pursuant to regulation 482.13 (G) (3) use of soft wrist restraints was logged on 07/04/2018 

## 2018-07-06 NOTE — Progress Notes (Signed)
3 Days Post-Op Procedure(s) (LRB): CORONARY ARTERY BYPASS GRAFTING (CABG) (N/A) TRANSESOPHAGEAL ECHOCARDIOGRAM (TEE) (N/A) ENDOVEIN HARVEST OF GREATER SAPHENOUS VEIN (Right) Subjective:  Has required higher dose vasopressors overnight to maintain BP. BE was +1 yesterday afternoon but 3 am was -8 and then -14 at 5:45 am. Correcting with bicarb. After two amps he is still -13.  PCT down to 4.68 this am. Lactic acid was 15.2 yesterday am and improved to 11.1 last night but 12.0 this am.  Co-ox 54 %  this am with arterial sat 96%  Objective: Vital signs in last 24 hours: Temp:  [95.2 F (35.1 C)-100.8 F (38.2 C)] 95.2 F (35.1 C) (12/29 0700) Pulse Rate:  [25-124] 112 (12/29 0324) Cardiac Rhythm: Atrial fibrillation;Sinus tachycardia (12/29 0400) Resp:  [3-30] 17 (12/29 0700) BP: (78-146)/(48-132) 93/79 (12/29 0459) SpO2:  [11 %-100 %] 43 % (12/28 2330) Arterial Line BP: (83-130)/(42-64) 109/57 (12/29 0700) FiO2 (%):  [100 %] 100 % (12/29 0325) Weight:  [78.6 kg] 78.6 kg (12/29 0446)  Hemodynamic parameters for last 24 hours: CVP:  [15 mmHg-17 mmHg] 16 mmHg  Intake/Output from previous day: 12/28 0701 - 12/29 0700 In: 5148.3 [I.V.:4288.7; IV Piggyback:859.6] Out: 205 [Urine:10] Intake/Output this shift: No intake/output data recorded.  General appearance: sedated on vent.  Neurologic: not responding to stimulus Heart: regular rate and rhythm, S1, S2 normal, no murmur, click, rub or gallop Lungs: fairly clear Abdomen: no BS, distended, firm. Extremities: anasarca, some mottling of toes Wound: dressings dry.  Lab Results: Recent Labs    07/02/18 2300 05/02/2018 0309 05/02/2018 0545  WBC 13.4* 10.0  --   HGB 9.6* 9.1* 8.2*  HCT 30.7* 30.2* 24.0*  PLT 80* 73*  --    BMET:  Recent Labs    07/02/18 2300 05/02/2018 0309 05/02/2018 0545  NA 140 139 135  K 4.8 5.3* 4.8  CL 103 102  --   CO2 17* 17*  --   GLUCOSE 71 57*  --   BUN 20 18  --   CREATININE 2.68* 2.50*  --    CALCIUM 6.5* 6.3*  --     PT/INR:  Recent Labs    06/25/2018 1400  LABPROT 22.6*  INR 2.02   ABG    Component Value Date/Time   PHART 7.237 (L) 2017-08-24 0732   HCO3 13.7 (L) 2017-08-24 0732   TCO2 15 (L) 2017-08-24 0732   ACIDBASEDEF 13.0 (H) 2017-08-24 0732   O2SAT 93.0 2017-08-24 0732   CBG (last 3)  Recent Labs    07/02/18 2341 05/02/2018 0316 05/02/2018 0342  GLUCAP 139* 49* 228*   CXR: diffuse interstitial edema about the same as yesterday. LLL atelectasis, may have small pleural effusions.  KUB: multiple air filled small bowel loops stacked up, mildly dilated. No significant colon air. No free air.  Assessment/Plan: S/P Procedure(s) (LRB): CORONARY ARTERY BYPASS GRAFTING (CABG) (N/A) TRANSESOPHAGEAL ECHOCARDIOGRAM (TEE) (N/A) ENDOVEIN HARVEST OF GREATER SAPHENOUS VEIN (Right)  POD 3  CV: Remains on high dose vasopressors and inotropes to support BP after PEA arrest two days ago. Have had to escalate pressor doses to maximum levels overnight to support BP. EF 40% by echo after arrest which is about the same as immediately postop. RV ok. No tamponade or RV dilation. Co-ox only 54 this am but probably due to lower arterial sat. He appears septic with elevated PCT, lactic acidosis and vasodilation. Antibiotics broadened to Maxepime. I think his worsening hemodynamics are due to progressive acidosis that is likely due to  end organ ischemia, shock liver dysfunction, probable gut ischemia. This is a downward spiral with poor prognosis for survival. I don't think there is anything else to do but support him. We can try to correct his acidosis but if there is gut ischemia or necrosis and max pressor doses I don't think this acidosis can be corrected.  Resp: VDRF with acute hypoxemic respiratory failure. CXR stable and oxygenation, ventilation stable. Still requiring 100% and 8 peep to maintain oxygenation.   Acute renal failure: secondary to preexisting CKD, pump run, periop  hypotension and cardiac arrest. CRRT going per nephrology but have not been able to remove any volume due to pressor requirement.   GI: ileus vs SBO. I remain concerned about ischemic gut.  Continue OG tube, NPO. Not stable enough for any further tests and would not be a candidate for surgical treatment at this time even if needed. Only treatment is support.  ID: concern for possible sepsis. On Maxipime. BC pending.  Neuro: he is not responding this am but on sedation and critically ill. Observe.     LOS: 4 days    Alleen BorneBryan K Bartle 06/23/2018

## 2018-07-06 NOTE — Progress Notes (Signed)
eLink Physician-Brief Progress Note Patient Name: Michael PhenixManherlal Hess DOB: 04/07/1951 MRN: [id number]   Date of Service  11-11-17  HPI/Events of Note  Hypotension - BP = 95/43 with MAP = 53. Patient is on ceiling doses of Epinephrine, Norepinephrine and Vasopressin IV infusions. CVP = 15. Hgb = 9.1. Ca++ = 6.3.  eICU Interventions  Will order: 1. NaHCO3 IV infusion at 50 mL/hour.  2. ABG at 5:30 AM. 3. Increase ceiling of Norepinephrine IV infusion to 70 mcg/min. 4. Replace Ca++.      Intervention Category Major Interventions: Hypotension - evaluation and management  , [name] 11-11-17, 5:16 AM

## 2018-07-06 NOTE — Consult Note (Signed)
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  Immigrant from [place].  Lives in the [place] area with the son for the last 6 years.  History of MI with 2 stent placements and angina 15 years ago.  Has chronic hyperlipidemia and hypertension.  Presented on June 29, 2018 with a few week history of anginal pain and code STEMI was activated.  EF 35% on admission .  On Jul 19, 2018 he underwent CABG x4 and extubated early hours of July 01, 2018.  On morning rounds July 01, 2018: Noted to have mild acute kidney injury early hours of July 03, 2018 Lasix drip was started.  1 unit of blood was also given.  He was already on milrinone.  With some Levophed but this was being weaned.  Chest tubes were discontinued.  He was found to have some gastric distention and his food intake was limited to sips.  Later in the evening he developed atrial fibrillation continued pressor needs, worsening acute kidney injury and subsequently developed prolonged PEA arrest with CPR.  Had ROSC  and was apparently was able to follow commands but currently is on the ventilator on 100% oxygen with PEEP 5 has profound circulatory failure with multiple pressor needs and inotropes and Lasix drip but has acute renal failure with a creatinine of 3.06 and anuric and hypothermic post arrest.  Critical care medicine called for consultation June 24, 2018.   Noted: Post cardiac arrest echo according to Dr. Virgel Gess ejection fraction 40% and unchanged from 3 cardiac arrest.  RV function is normal.  No pericardial effusion.  No tamponade.  Valves all appeared okay.  Gastric distention improved after placement of an OG tube   Significant Hospital Events   06/12/2018- admit 12/28 - ccm consult.    12/28 - on precedex, lasix gtt, amio gtt, epi 6, levophed 75, neo 100, and milrinone 0.25. 100% fip2/peep 5. Hypothermic. Bair hugger on  Consults:  12/28 - ccm 12/28 - renal  Procedures:  12/26 - CABG Radial aline ? Date and side >> 12/27   left femoral aline 12/27>> Rt IJ introducer ? Date > changed to HD cath 12/28 >> Rt Subclavian Triple lumen Foley OG tube ETT 12/26 > 12/26, 12/27 2am >> ETT 12/27 late PM >>  Significant Diagnostic Tests:  x  Micro Data:  RVP 12/28 - neg Urine strep 12/28 - neg Urine leg 12/28 Blood culture 12/28 PCT 12/28  Antimicrobials:  levaquin 12/26 >.12/27 vanc 12/28  Cefepime 12/28??     Interim history/subjective:   12/29 - MODS with CRRT., 100% fio2, maxed on multiple pressors, amio and milrinone. Barely maintaining SBP. Son at bedside - in distress but says no cpr. Hypothermic - needing bair hugger. Severe lactic acidosis +  Objective   Blood pressure (!) 46/33, pulse 85, temperature (!) 93.6 F (34.2 C), resp. rate (!) 26, height 5\' 2"  (1.575 m), weight 78.6 kg, SpO2 (!) 35 %. CVP:  [15 mmHg-23 mmHg] 23 mmHg  Vent Mode: PRVC FiO2 (%):  [100 %] 100 % Set Rate:  [20 bmp-24 bmp] 24 bmp Vt Set:  [440 mL] 440 mL PEEP:  [5 cmH20-8 cmH20] 8 cmH20 Plateau Pressure:  [20 cmH20-24 cmH20] 24 cmH20   Intake/Output Summary (Last 24 hours) at  06/26/2018 0834 Last data filed at 06/19/2018 0800 Gross per 24 hour  Intake 4479.42 ml  Output 205 ml  Net 4274.42 ml   Filed Weights   07/01/18 0500 07/02/18 0500 06/19/2018 0446  Weight: 73 kg 74.4 kg 78.6 kg     General Appearance:  Looks criticall ill ++ Head:  Normocephalic, without obvious abnormality, atraumatic Eyes:  PERRL - yes, conjunctiva/corneas - mudd     Ears:  Normal external ear canals, both ears Nose:  G tube - no Throat:  ETT TUBE - yes , OG tube - yes Neck:  Supple,  No enlargement/tenderness/nodules Lungs: Clear to auscultation bilaterally, Ventilator   Synchrony -  yes. On 100% fio3 Heart:  S1 and S2 normal, no murmur, CVP - x.  Pressors - yes on multiple Abdomen:  Soft, no masses, no organomegaly Genitalia / Rectal:  Not done Extremities:  Extremities- intact Skin: developing mottling Neurologic:  Sedation - on gtt -> RASS - -4 .          LABS    PULMONARY Recent Labs  Lab 07/02/18 1415 06/07/2018 0318 06/26/2018 0345 06/19/2018 0545 07/01/2018 0648 06/24/2018 0732  PHART 7.393 7.285*  --  7.218* 7.258* 7.237*  PCO2ART 43.2 37.7  --  31.7* 33.6 31.3*  PO2ART 59.0* 65.0*  --  90.0 65.0* 68.0*  HCO3 26.1 18.0*  --  13.0* 15.3* 13.7*  TCO2 27 19*  --  14* 16* 15*  O2SAT 89.0 91.0 54.4 96.0 92.0 93.0    CBC Recent Labs  Lab 07/02/18 0420 07/02/18 2300 06/18/2018 0309 06/19/2018 0545  HGB 10.5* 9.6* 9.1* 8.2*  HCT 32.7* 30.7* 30.2* 24.0*  WBC 15.3* 13.4* 10.0  --   PLT 111* 80* 73*  --     COAGULATION Recent Labs  Lab 2018-07-11 2024 06/27/2018 1400  INR 1.07 2.02    CARDIAC   Recent Labs  Lab 07/11/2018 1637 2018-07-11 2024 06/27/2018 0254  TROPONINI 0.11* 3.98* 41.24*   No results for input(s): PROBNP in the last 168 hours.   CHEMISTRY Recent Labs  Lab 07/01/18 0410  07/01/18 1705  07/01/18 2029  07/02/18 0420 07/02/18 1201 07/02/18 1744 07/02/18 2300 07/02/2018 0309 07/01/2018 0545 06/16/2018 0647  NA 145   < >  --    < > 149*   < > 148* 146* 143 140 139 135 138  K 3.2*   < >  --    < > 4.9   < > 3.8 4.0 4.3 4.8 5.3* 4.8 5.3*  CL 110  --   --    < > 103   < > 101 100 102 103 102  --  100  CO2 23  --   --   --  21*  --  16* 20* 21* 17* 17*  --  15*  GLUCOSE 121*   < >  --    < > 161*   < > 168* 183* 129* 71 57*  --  114*  BUN 18  --   --    < > 20   < > 25* 27* 25* 20 18  --  14  CREATININE 1.67*  --  1.99*   < > 2.55*   < > 3.06* 3.36* 3.20* 2.68* 2.50*  --  2.18*  CALCIUM 7.0*  --   --   --  6.5*  --  6.5* 6.3* 6.5* 6.5* 6.3*  --  7.2*  MG 2.9*  --  2.8*  --  3.1*  --  2.7*  --   --   --  2.5*  --   --   PHOS  --    --   --   --   --   --   --   --  2.7  --  4.3  --   --    < > = values in this interval not displayed.   Estimated Creatinine Clearance: 29.4 mL/min (A) (by C-G formula based on SCr of 2.18 mg/dL (H)).   LIVER Recent Labs  Lab 06/15/2018 2024 02-26-2018 0254 02-26-2018 1400 07/02/18 1744 06/26/2018 0309  AST 54* 125*  --   --  4,680*  ALT 16 24  --   --  3,575*  ALKPHOS 52 39  --   --  64  BILITOT 0.7 0.9  --   --  2.0*  PROT 7.0 6.2*  --   --  4.1*  ALBUMIN 3.6 3.3*  --  2.9* 2.7*  2.7*  INR 1.07  --  2.02  --   --      INFECTIOUS Recent Labs  Lab 07/02/18 1052 07/02/18 1055 07/02/18 1813 06/08/2018 0309  LATICACIDVEN 15.2*  --  11.1* 12.0*  PROCALCITON  --  7.27  --  4.68     ENDOCRINE CBG (last 3)  Recent Labs    07/02/18 2341 07/05/2018 0316 06/15/2018 0342  GLUCAP 139* 49* 228*         IMAGING x48h  - image(s) personally visualized  -   highlighted in bold Dg Abd 1 View  Result Date: 07/02/2018 CLINICAL DATA:  Ileus. Distal supine abdominal image acquired first, then pt started having difficulty so the nurse raised him to a semi-erect position. Proximal abdominal film is semi-erect. EXAM: ABDOMEN - 1 VIEW COMPARISON:  Chest x-ray on 07/02/2018 FINDINGS: Central air filled bowel loops raise the question of ascites. Paucity of colonic gas. There is dilatation of small bowel in the LEFT central abdomen raising the question of small bowel obstruction. A nasogastric tube is in place, tip overlying the region of the stomach. Diffuse body wall edema noted. IMPRESSION: 1. Dilated small bowel loops raising the question of small bowel obstruction. 2. Question of ascites. 3. Consider further evaluation CT of the abdomen and pelvis. Electronically Signed   By: Norva PavlovElizabeth  Brown M.D.   On: 07/02/2018 11:15   Dg Chest Port 1 View  Result Date: 07/02/2018 CLINICAL DATA:  Postop day 3 CABG. Cardiac arrest (PEA) with acidosis and progressive renal failure and hypotension.  Ventilator dependent respiratory failure requiring 100% oxygen. EXAM: PORTABLE CHEST 1 VIEW 1:13 p.m.: COMPARISON:  Portable chest x-ray earlier same day 6:01 a.m. and previously. FINDINGS: Endotracheal tube tip approximately 2-3 cm above the carina. RIGHT jugular central venous catheter tip at or near the cavoatrial junction, unchanged. RIGHT subclavian central venous catheter tip at or near the cavoatrial junction, unchanged. Nasogastric tube looped in the stomach with its tip in the antrum, unchanged. LEFT chest tube in place with no pneumothorax. External pacing pads. Sternotomy for CABG. Cardiac silhouette moderately to markedly enlarged. Mild diffuse interstitial pulmonary edema, unchanged since earlier in the day, allowing for differences in radiographic technique. Stable dense atelectasis involving the LEFT LOWER LOBE. No new abnormalities. IMPRESSION: 1. Support apparatus satisfactory and unchanged. 2. Stable mild diffuse interstitial pulmonary edema. 3. Stable dense atelectasis involving the LEFT LOWER LOBE. 4. No new abnormalities. Electronically Signed   By: Hulan Saashomas  Lawrence M.D.   On: 07/02/2018 13:36   Dg Chest  Port 1 View  Result Date: 07/02/2018 CLINICAL DATA:  Status post CABG EXAM: PORTABLE CHEST 1 VIEW COMPARISON:  Yesterday FINDINGS: Endotracheal tube tip with tip 3 cm above the carina. The orogastric tube reaches the stomach. Right subclavian and IJ lines with tips at the SVC and upper cavoatrial junction. Stable cardiomegaly.  Status post CABG. Continued retrocardiac opacification. Posterior left first rib fracture. IMPRESSION: Stable hardware positioning and atelectasis. Posterior left first rib fracture. Electronically Signed   By: Marnee Spring M.D.   On: 07/02/2018 08:23   Dg Chest Port 1 View  Result Date: 07/01/2018 CLINICAL DATA:  Central venous line placement. EXAM: PORTABLE CHEST 1 VIEW COMPARISON:  July 01, 2018 7:39 p.m. FINDINGS: The heart size and mediastinal  contours are stable. The heart size is enlarged. Right central venous line is identified with distal tip in the superior vena cava/right atrial junction. Right jugular vascular sheath is identified distal tip in the superior vena cava. Endotracheal tube is identified distal tip 3.2 cm from carina. Nasogastric tube is identified with distal tip not included on film but probably at least in the stomach. Patchy consolidation of left lung base, right upper lobe and right lung base are identified. The visualized skeletal structures are unremarkable. IMPRESSION: Right subclavian central venous line distal tip in the superior vena cava/right atrial junction. There is no pneumothorax. Worsened patchy consolidation of the right lung. Persistent consolidation of left lung base. Electronically Signed   By: Sherian Rein M.D.   On: 07/01/2018 23:42   Dg Chest Port 1 View  Result Date: 07/01/2018 CLINICAL DATA:  CPR, respiratory failure. EXAM: PORTABLE CHEST 1 VIEW COMPARISON:  July 01, 2018 6:16 a.m. FINDINGS: The heart size and mediastinal contours are stable. Endotracheal tube is identified distal tip 2.3 cm from carina. Retraction by 1.5 cm is recommended. A right jugular vascular sheath is noted with distal tip in the superior vena cava. There is mild central pulmonary vascular congestion. Patchy consolidation of the medial left lung base is unchanged. There is no pneumothorax. The visualized skeletal structures are stable. IMPRESSION: Endotracheal tube is identified with distal tip 2.3 cm from carina. Retraction by 1.5 cm is recommended. Right jugular vascular sheath is noted with distal tip in the superior vena cava. Mild central pulmonary vascular congestion. These results will be called to the ordering clinician or representative by the Radiologist Assistant, and communication documented in the PACS or zVision Dashboard. Electronically Signed   By: Sherian Rein M.D.   On: 07/01/2018 20:20  ;  Resolved  Hospital Problem list   x  Assessment & Plan:  ASSESSMENT / PLAN:  PULMONARY A:  Post arrest, post=op resp failure 07/01/18  2018-07-15 - > does not meet criteria for SBT/Extubation in setting of Acute Respiratory Failure due to MODS  P:   PRVC VAP bundle  CARDIOVASCULAR A:   Known CAD - MI with systolic chf 07/04/2018 ./ sp CAB 06/18/2018 Circulatory failure post CABG as well 06/18/2018 - 07/01/18 PEA of unclear etiology with prolonged CPR 07/01/18   12.29 - refractory circulatory shock  P:  Get ekg MAP goal > 50 and SBP > 85; if possible Continue epi, levophed, milrinone, vasopressin AVoid QTc prolongation drugs   RENAL A:  AKI probably even at admit 07/01/2018 and progressively worse. On CRRT since 07/02/18  12/29 - CRRT continues  P:  Per renal   ELECTROLYTES A:  Severe Metabolic Acidosis P: Start bic gtt   GASTROINTESTINAL A:   Ileus discovered post  op 2018-02-04 and 07/01/18 but son  On 12/29 reports GI symptoms pre-admit. Hx of normal colonoscopy 3-4 y ago  12/.28 - abd soft and distension better after OG tube. XRay with SBO/ileus  P:   NPO Too sick for CT or CCS consult   HEMATOLOGIC A:  Post op anemia without overt bleeding  12/29 - hgb just ove 8gm%  P:  - recheck cbc  And decide on PRBC - PRBC for hgb </= 8.0gm%  Due to ACS   INFECTIOUS A:   Concern for sepsis with elevated PCT but could be false positive. Culture/antigen negative so fare P:   Continue broad abx   ENDOCRINE A:   At risk hyperglycemia   P:   ssi  NEUROLOGIC A:   At risk anoxic injury but has followed some commands post arrest 07/01/18 P:   Monitor  GLOBAL Gravely ill, Poor prognosis. Dw Dr Laneta SimmersBartle and Dr Antoine PocheHochrein at bedside   Best practice:  Diet: npo Pain/Anxiety/Delirium protocol (if indicated): fent gtt VAP protocol (if indicated): yes DVT prophylaxis: per cvts GI prophylaxis: ppi Glucose control: icu ssi Mobility: bed rest Code Status: full  medical care but no CPR Family Communication: updated son Debroah LoopKarthik.  Disposition: ICU     ATTESTATION & SIGNATURE   The patient Skylur Allena Katzatel is critically ill with multiple organ systems failure and requires high complexity decision making for assessment and support, frequent evaluation and titration of therapies, application of advanced monitoring technologies and extensive interpretation of multiple databases.   Critical Care Time devoted to patient care services described in this note is  60  Minutes. This time reflects time of care of this signee Dr Kalman ShanMurali Zion Lint. This critical care time does not reflect procedure time, or teaching time or supervisory time of PA/NP/Med student/Med Resident etc but could involve care discussion time     Dr. Kalman ShanMurali Shy Guallpa, M.D., [facility]P Pulmonary and Critical Care Medicine Staff Physician [facility] Pager: [phone], If no answer or between  15:00h - 7:00h: call [phone]  06/16/2018 8:53 AM

## 2018-07-06 NOTE — Progress Notes (Signed)
Hypoglycemic Event  CBG: 49  Treatment: D50 50 mL (25 gm)  Symptoms: None  Follow-up CBG: Time:0342 CBG Result: 228  Possible Reasons for Event: Unknown  Comments/MD notified: CCM MD notified    Jeri ModenaPerrin, Marquiz Sotelo Harold

## 2018-07-06 NOTE — Plan of Care (Signed)
  Problem: Education: Goal: Knowledge of General Education information will improve Description Including pain rating scale, medication(s)/side effects and non-pharmacologic comfort measures Outcome: Not Progressing   Problem: Health Behavior/Discharge Planning: Goal: Ability to manage health-related needs will improve Outcome: Not Progressing   Problem: Activity: Goal: Risk for activity intolerance will decrease Outcome: Not Progressing   Problem: Cardiac: Goal: Will achieve and/or maintain hemodynamic stability Outcome: Not Progressing   Problem: Clinical Measurements: Goal: Postoperative complications will be avoided or minimized Outcome: Not Progressing   Problem: Respiratory: Goal: Respiratory status will improve Outcome: Not Progressing

## 2018-07-06 NOTE — Progress Notes (Signed)
At request of son  - I updated their friend Dr Sherryll BurgerShah  [facility] [place]

## 2018-07-06 NOTE — Progress Notes (Signed)
[facility] Progress Note  Name: Michael Hess MRN: [id number] DOB: 25-May-1951   Subjective:  Spoke with nursing.  Patient has been on CRRT.  He has not been able to tolerate pulling fluid and has continued with high pressor requirements - levo, epi, and vaso with milrinone.  Continues with high vent requirements as well - FiO2 remains at 100%.  Bicarb gtt was ordered for acidosis but has not been hung.  Review of systems:  Unable to obtain secondary to sedated/intubated -------------- Background on consult:  Michael Hess is a 68 y.o. male with a history of coronary artery disease who presented with to the hospital with worsening chest pain on 12/25 and was found to have a STEMI.  He underwent CABG on 12/26 after cath showed severe three vessel disease.  His course has been complicated by PEA arrest with CPR with ROSC.  Per nursing he was pulseless for a short duration (under 4-5 minutes) on two occasions.  He was intubated and has been on multiple pressors - epi vaso-and Levophed as well as milrinone.  He has been on a lasix gtt.  He had 400 mL UOP charted over 12/27.  I spoke with the patient's son at bedside.  He states that he would want any measures that would help his father.  They are specifically in agreement with pursuing CRRT/dialysis.  He denies any known history of NSAID use over the years other than just intermittently.  He states that his dad did have prostate surgery about 6 years ago but is not sure of additional details.   Intake/Output Summary (Last 24 hours) at 06/18/2018 0518 Last data filed at 06/11/2018 0500 Gross per 24 hour  Intake 4441.43 ml  Output 191 ml  Net 4250.43 ml    Vitals:  Vitals flowsheet reviewed  Vitals:   06/19/2018 0430 07/02/2018 0445 06/20/2018 0446 06/14/2018 0459  BP:    93/79  Pulse:      Resp: (!) 21 (!) 24  (!) 24  Temp: 97.7 F (36.5 C) (!) 97.5 F (36.4 C)  (!) 97.5 F (36.4 C)  TempSrc:      SpO2:      Weight:   78.6  kg   Height:         Physical Exam:  General adult male intubated critically ill  Lungs coarse mechanical breath sounds Heart S1S2 Abdomen soft nondistended Extremities 1+ edema lower extremities Neuro - sedation running  Medications reviewed   Labs:  BMP Latest Ref Rng & Units 07/02/2018 07/02/2018 07/02/2018  Glucose 70 - 99 mg/dL 78(G57(L) 71 956(O129(H)  BUN 8 - 23 mg/dL 18 20 13(Y25(H)  Creatinine 0.61 - 1.24 mg/dL 8.65(H2.50(H) 8.46(N2.68(H) 6.29(B3.20(H)  Sodium 135 - 145 mmol/L 139 140 143  Potassium 3.5 - 5.1 mmol/L 5.3(H) 4.8 4.3  Chloride 98 - 111 mmol/L 102 103 102  CO2 22 - 32 mmol/L 17(L) 17(L) 21(L)  Calcium 8.9 - 10.3 mg/dL 6.3(LL) 6.5(L) 6.5(L)     Assessment/Plan:   # AKI  - Secondary to ischemic ATN as well as pre-renal insults with lasix  - Continue CRRT.  Goal UF is net even as tolerated (which he has not yet been able to achieve).  Note patient continues on multiple pressors  - increase dialysate to 2 L/hr - Asked RN to increase blood flow to 250 ml/min (currently running at 200) - Hyperkalemia; note Mag and phos acceptable - Repeat BMP at 7am  # Hyperkalemia - Change carrier fluid from LR to  NS (not running)  # s/p PEA arrest  - Supportive care per pulm/critical care and CTS  # Cardiogenic shock - Continue pressors  - Note hx HTN   # Metabolic acidosis, acute - Secondary to AKI, arrest - CRRT as above - Would defer bicarb gtt and modify CRRT as above  - Agree with 1 amp of bicarb (discontinued my order to avoid duplicate as primary team has just ordered via RN)  # Acute hypoxic respiratory failure - On mechanical ventilation  - optimize volume status as able with CRRT   # Hypocalcemia - for repletion with calcium 2 gram IV now - obtain ionized calcium   # CAD  - s/p CABG  # CKD stage III - Noted mild baseline renal impairment at presentation and in 2016 per labs above  # Anemia  - Acute blood loss/peri-op - s/p PRBC's with transfusion per discretion  of primary team   Estanislado EmmsLori C Foster, MD 02-26-2018 5:18 AM

## 2018-07-06 NOTE — Progress Notes (Signed)
Time of death: [id number] - confirmed by Aviva SignsJessica Omari Koslosky, RN and Prince RomeJessica Milford, RN. Family at bedside. Dr. Marchelle Gearingamaswamy and Dr. Laneta SimmersBartle notified who stated this would not be an ME case. Patient died on the ventilator and was extubated afterwards per the family request.  CDS aware of time of death. Not suitable for donation.

## 2018-07-06 NOTE — Progress Notes (Signed)
Non-violent restraints removed and order discontinued.

## 2018-07-06 NOTE — Progress Notes (Signed)
Progress Note  Patient Name: Michael Hess Date of Encounter: Jul 20, 2018  Primary Cardiologist:   Kathlyn Sacramento, MD   Subjective   Intubated, unresponsive  Inpatient Medications    Scheduled Meds: . aspirin EC  325 mg Oral Daily   Or  . aspirin  324 mg Per Tube Daily  . chlorhexidine gluconate (MEDLINE KIT)  15 mL Mouth Rinse BID  . insulin aspart  0-9 Units Subcutaneous Q4H  . insulin detemir  20 Units Subcutaneous BID  . mouth rinse  15 mL Mouth Rinse 10 times per day  . pantoprazole (PROTONIX) IV  40 mg Intravenous Q24H   Continuous Infusions: .  prismasol BGK 4/2.5 300 mL/hr at 07/02/18 1529  .  prismasol BGK 4/2.5 300 mL/hr at 07/02/18 1530  . sodium chloride 10 mL/hr at 07-20-2018 0900  . amiodarone 30 mg/hr (07-20-2018 0900)  . calcium gluconate 1,000 mg (07-20-2018 1009)  . ceFEPime (MAXIPIME) IV 2 g (07-20-18 0722)  . dextrose 30 mL/hr at 2018-07-20 0900  . epinephrine 30 mcg/min (07-20-2018 1010)  . fentaNYL infusion INTRAVENOUS 25 mcg/hr (Jul 20, 2018 0900)  . milrinone 0.25 mcg/kg/min (Jul 20, 2018 0900)  . norepinephrine (LEVOPHED) Adult infusion 70 mcg/min (07/20/2018 1012)  . prismasol BGK 4/2.5 2,000 mL/hr (07/20/2018 0551)  .  sodium bicarbonate (isotonic) infusion in sterile water 50 mL/hr at July 20, 2018 0900  . vancomycin    . vasopressin (PITRESSIN) infusion - *FOR SHOCK* 0.04 Units/min (2018-07-20 0900)   PRN Meds: sodium chloride, fentaNYL, heparin, levalbuterol, midazolam, ondansetron (ZOFRAN) IV   Vital Signs    Vitals:   07/20/18 0900 2018/07/20 0915 07/20/18 0930 July 20, 2018 0945  BP:      Pulse:      Resp: (!) 27 (!) 28 (!) 27 (!) 27  Temp: (!) 93.2 F (34 C) (!) 93.2 F (34 C) (!) 93.2 F (34 C) (!) 93.2 F (34 C)  TempSrc:      SpO2:      Weight:      Height:        Intake/Output Summary (Last 24 hours) at 2018-07-20 1022 Last data filed at 07-20-2018 0900 Gross per 24 hour  Intake 4322.43 ml  Output 205 ml  Net 4117.43 ml   Filed Weights   07/01/18 0500 07/02/18 0500 2018/07/20 0446  Weight: 73 kg 74.4 kg 78.6 kg    Telemetry     NSR, first degree AV block. - Personally Reviewed  ECG    Atrial fib.  Baseline artifact.  Low voltage.   - Personally Reviewed  Physical Exam   GEN: No acute distress.   Neck: No  JVD Cardiac: RRR, soft apical systolic murmurs, rubs, or gallops.  Respiratory:    Decreased breath sounds GI:  Distended, absent bowel sounds MS:   Mild diffuse non pitting edema.  Extremities slightly mottled.  Neuro:  Intubated and sedated.    Labs    Chemistry Recent Labs  Lab 06/25/2018 2024 06/14/2018 0254  07/02/18 1744 07/02/18 2300 07-20-2018 0309 07/20/2018 0545 07/20/18 0647 07/20/2018 0900  NA  --  138   < > 143 140 139 135 138 136  K  --  4.0   < > 4.3 4.8 5.3* 4.8 5.3* 5.1  CL  --  109   < > 102 103 102  --  100 98  CO2  --  22   < > 21* 17* 17*  --  15*  --   GLUCOSE  --  115*   < > 129*  71 57*  --  114* 110*  BUN  --  17   < > 25* 20 18  --  14 14  CREATININE  --  1.28*   < > 3.20* 2.68* 2.50*  --  2.18* 2.00*  CALCIUM  --  8.5*   < > 6.5* 6.5* 6.3*  --  7.2*  --   PROT 7.0 6.2*  --   --   --  4.1*  --   --   --   ALBUMIN 3.6 3.3*  --  2.9*  --  2.7*  2.7*  --   --   --   AST 54* 125*  --   --   --  1,308*  --   --   --   ALT 16 24  --   --   --  3,575*  --   --   --   ALKPHOS 52 39  --   --   --  64  --   --   --   BILITOT 0.7 0.9  --   --   --  2.0*  --   --   --   GFRNONAA  --  57*   < > 19* 23* 25*  --  30*  --   GFRAA  --  >60   < > 22* 27* 29*  --  35*  --   ANIONGAP  --  7   < > 20* 20* 20*  --  23*  --    < > = values in this interval not displayed.     Hematology Recent Labs  Lab 07/02/18 0420 07/02/18 2300 07/08/2018 0309 2018/07/08 0545 07/08/2018 0900  WBC 15.3* 13.4* 10.0  --   --   RBC 4.12* 3.85* 3.74*  --   --   HGB 10.5* 9.6* 9.1* 8.2* 7.8*  HCT 32.7* 30.7* 30.2* 24.0* 23.0*  MCV 79.4* 79.7* 80.7  --   --   MCH 25.5* 24.9* 24.3*  --   --   MCHC 32.1 31.3 30.1  --    --   RDW 17.9* 18.1* 18.7*  --   --   PLT 111* 80* 73*  --   --     Cardiac Enzymes Recent Labs  Lab 06/23/2018 1637 06/28/2018 2024 06/12/2018 0254  TROPONINI 0.11* 3.98* 41.24*   No results for input(s): TROPIPOC in the last 168 hours.   BNP Recent Labs  Lab 06/19/2018 2008  BNP 399.0*     DDimer No results for input(s): DDIMER in the last 168 hours.   Radiology    Dg Abd 1 View  Result Date: Jul 08, 2018 CLINICAL DATA:  Respiratory failure requiring intubation. Yesterday they were tracking an ileus please look at accompanying chest xray as well Pt was elevated in semi-erect position. These are the best images I can do given his condition and everything attached EXAM: ABDOMEN - 1 VIEW COMPARISON:  07/02/2018 and chest x-ray today FINDINGS: Central gas-filled bowel loops are present. This raises the question of ascites. There is less dilatation of small bowel loops in the LEFT mid abdomen compared to prior study. No evidence for free intraperitoneal air on this semi-erect view of the abdomen. Study quality is degraded by patient's condition.Diffuse body wall edema. IMPRESSION: 1. Improvement in small bowel dilatation. 2. Suspect ascites. Electronically Signed   By: Nolon Nations M.D.   On: 07-08-18 09:50   Dg Abd 1 View  Result Date: 07/02/2018 CLINICAL DATA:  Ileus.  Distal supine abdominal image acquired first, then pt started having difficulty so the nurse raised him to a semi-erect position. Proximal abdominal film is semi-erect. EXAM: ABDOMEN - 1 VIEW COMPARISON:  Chest x-ray on 07/02/2018 FINDINGS: Central air filled bowel loops raise the question of ascites. Paucity of colonic gas. There is dilatation of small bowel in the LEFT central abdomen raising the question of small bowel obstruction. A nasogastric tube is in place, tip overlying the region of the stomach. Diffuse body wall edema noted. IMPRESSION: 1. Dilated small bowel loops raising the question of small bowel  obstruction. 2. Question of ascites. 3. Consider further evaluation CT of the abdomen and pelvis. Electronically Signed   By: Nolon Nations M.D.   On: 07/02/2018 11:15   Dg Chest Port 1 View  Result Date: 07-17-18 CLINICAL DATA:  Respiratory failure EXAM: PORTABLE CHEST 1 VIEW COMPARISON:  Chest radiograph 07/02/2018 FINDINGS: ET tube terminates in the mid trachea. Right IJ sheath projects over the superior vena cava. Right subclavian central venous catheter tip projects over the superior vena cava. Enteric tube courses inferior to the diaphragm. Monitoring leads overlie the patient. Interval removal left chest tube. Stable cardiomegaly. Persistent moderate left pleural effusion with similar-appearing consolidation throughout the left lung. Persistent right mid lower lung consolidative opacities. No pneumothorax. IMPRESSION: Support apparatus as above. Moderate left pleural effusion with persistent opacities within the left and right lungs. Electronically Signed   By: Lovey Newcomer M.D.   On: 07-17-2018 09:40   Dg Chest Port 1 View  Result Date: 07/02/2018 CLINICAL DATA:  Postop day 3 CABG. Cardiac arrest (PEA) with acidosis and progressive renal failure and hypotension. Ventilator dependent respiratory failure requiring 100% oxygen. EXAM: PORTABLE CHEST 1 VIEW 1:13 p.m.: COMPARISON:  Portable chest x-ray earlier same day 6:01 a.m. and previously. FINDINGS: Endotracheal tube tip approximately 2-3 cm above the carina. RIGHT jugular central venous catheter tip at or near the cavoatrial junction, unchanged. RIGHT subclavian central venous catheter tip at or near the cavoatrial junction, unchanged. Nasogastric tube looped in the stomach with its tip in the antrum, unchanged. LEFT chest tube in place with no pneumothorax. External pacing pads. Sternotomy for CABG. Cardiac silhouette moderately to markedly enlarged. Mild diffuse interstitial pulmonary edema, unchanged since earlier in the day, allowing for  differences in radiographic technique. Stable dense atelectasis involving the LEFT LOWER LOBE. No new abnormalities. IMPRESSION: 1. Support apparatus satisfactory and unchanged. 2. Stable mild diffuse interstitial pulmonary edema. 3. Stable dense atelectasis involving the LEFT LOWER LOBE. 4. No new abnormalities. Electronically Signed   By: Evangeline Dakin M.D.   On: 07/02/2018 13:36   Dg Chest Port 1 View  Result Date: 07/02/2018 CLINICAL DATA:  Status post CABG EXAM: PORTABLE CHEST 1 VIEW COMPARISON:  Yesterday FINDINGS: Endotracheal tube tip with tip 3 cm above the carina. The orogastric tube reaches the stomach. Right subclavian and IJ lines with tips at the SVC and upper cavoatrial junction. Stable cardiomegaly.  Status post CABG. Continued retrocardiac opacification. Posterior left first rib fracture. IMPRESSION: Stable hardware positioning and atelectasis. Posterior left first rib fracture. Electronically Signed   By: Monte Fantasia M.D.   On: 07/02/2018 08:23   Dg Chest Port 1 View  Result Date: 07/01/2018 CLINICAL DATA:  Central venous line placement. EXAM: PORTABLE CHEST 1 VIEW COMPARISON:  July 01, 2018 7:39 p.m. FINDINGS: The heart size and mediastinal contours are stable. The heart size is enlarged. Right central venous line is identified with distal tip in the  superior vena cava/right atrial junction. Right jugular vascular sheath is identified distal tip in the superior vena cava. Endotracheal tube is identified distal tip 3.2 cm from carina. Nasogastric tube is identified with distal tip not included on film but probably at least in the stomach. Patchy consolidation of left lung base, right upper lobe and right lung base are identified. The visualized skeletal structures are unremarkable. IMPRESSION: Right subclavian central venous line distal tip in the superior vena cava/right atrial junction. There is no pneumothorax. Worsened patchy consolidation of the right lung. Persistent  consolidation of left lung base. Electronically Signed   By: Abelardo Diesel M.D.   On: 07/01/2018 23:42   Dg Chest Port 1 View  Result Date: 07/01/2018 CLINICAL DATA:  CPR, respiratory failure. EXAM: PORTABLE CHEST 1 VIEW COMPARISON:  July 01, 2018 6:16 a.m. FINDINGS: The heart size and mediastinal contours are stable. Endotracheal tube is identified distal tip 2.3 cm from carina. Retraction by 1.5 cm is recommended. A right jugular vascular sheath is noted with distal tip in the superior vena cava. There is mild central pulmonary vascular congestion. Patchy consolidation of the medial left lung base is unchanged. There is no pneumothorax. The visualized skeletal structures are stable. IMPRESSION: Endotracheal tube is identified with distal tip 2.3 cm from carina. Retraction by 1.5 cm is recommended. Right jugular vascular sheath is noted with distal tip in the superior vena cava. Mild central pulmonary vascular congestion. These results will be called to the ordering clinician or representative by the Radiologist Assistant, and communication documented in the PACS or zVision Dashboard. Electronically Signed   By: Abelardo Diesel M.D.   On: 07/01/2018 20:20    Cardiac Studies   ECHO :   Study Conclusions  - Left ventricle: The cavity size was normal. Wall thickness was   normal.  Impressions:  - Limited exam. Not adequate to fully assess LVEF as not all walls   imaged (inferior, anterior). There is severe hypokinesis of the   mid/distal septal wall and apex.   12/25 Cath   Mid RCA lesion is 100% stenosed.  Dist LM lesion is 40% stenosed.  Ost LAD to Prox LAD lesion is 99% stenosed.  Mid Cx lesion is 99% stenosed.  Ost 1st Diag lesion is 100% stenosed.  Prox Cx lesion is 50% stenosed.  There is mild to moderate left ventricular systolic dysfunction.  The left ventricular ejection fraction is 35-45% by visual estimate.  LV end diastolic pressure is moderately  elevated.  Patient Profile     68 y.o. male with a hx of coronary artery disease status post stent placement x2 in [place] 15 years agowho is being seen for the evaluation of chest pain and abnormal EKGat the request of Dr. Clearnce Hasten. Found to have CAD as above. CABG with subsequent multiorgan failure.   Assessment & Plan    Shock:  Cardiogenic/septic possibly.  Multiorgan failure despite max support.  I have reviewed the situation with Dr. Cyndia Bent and Dr. Chase Caller.  He has max support and likely will not survive this.  Continuing current care but no further escalation of care possible and the patient will not be resuscitated with shocks or CPR.    Atrial fib:  Transient atrial fib.  Now appears to have slowing rhythm with NSR and first degree AV block.  No change in therapy.   For questions or updates, please contact [facility] Please consult [email or web address] for contact info under Cardiology/STEMI.   Signed, Minus Breeding, MD  07/18/18,  10:22 AM

## 2018-07-06 NOTE — Progress Notes (Addendum)
Notified MD Dellie CatholicSommers, MD Laneta SimmersBartle and MD Malen GauzeFoster r/t   -critical -Calcium @ 6.2, ionized Calcium 0.8 - Hypotension -Acidosis/ ABG- HC03 13   Orders given to increase Levophed,  2 amps Bicarb, Calcium gluconate, and recheck ABG in 1 hour, CXR & KUB

## 2018-07-06 NOTE — Procedures (Signed)
Extubation Procedure Note  Patient Details:   Name: Michael Hess DOB: 09-17-1950 MRN: [id number]   Airway Documentation:  Airway 7.5 mm (Active)  Secured at (cm) 24 cm 06/18/2018 11:43 AM  Measured From Lips 07/01/2018 11:43 AM  Secured Location Right 06/24/2018 11:43 AM  Secured By [employer] Tube Holder 06/25/2018 11:43 AM  Tube Holder Repositioned Yes 06/19/2018 11:43 AM  Cuff Pressure (cm H2O) 26 cm H2O 06/12/2018  7:54 AM  Site Condition Dry 06/17/2018 11:43 AM   Vent end date: 07/01/18 Vent end time: 0235   Evaluation  O2 sats: patient expired Complications: Complications of patient expired Patient expired tolerate procedure well. Bilateral Breath Sounds: Clear, Diminished   No   Patient expired on ventilator. Per MD order this RT extubated patient and removed ETT per family wishes.   [name] 06/25/2018, 1:58 PM

## 2018-07-06 NOTE — Progress Notes (Signed)
Hbg 7.9 reported to Dr. Marchelle Gearingamaswamy - verbal order received to infuse 1 u PRBC.

## 2018-07-06 NOTE — Progress Notes (Signed)
Patient's son at bedside - updated by Dr. Marchelle Gearingamaswamy.  Additional push of sodium bicarb given - drip has been ordered. Vasopressin increased to 0.04.   Patient's temp 34.2 despite bair hugger and warmer on CRRT. Epi at 30 mcg, Levo at 70 mcg, and Vaso at 0.04. BP 88/46 (56).

## 2018-07-06 DEATH — deceased

## 2018-07-07 LAB — CULTURE, BLOOD (ROUTINE X 2)
Culture: NO GROWTH
Culture: NO GROWTH
Special Requests: ADEQUATE

## 2018-07-07 MED FILL — Medication: Qty: 1 | Status: AC

## 2018-08-06 NOTE — Discharge Summary (Addendum)
DEATH SUMMARY   Patient Details  Name: Michael Hess MRN: [id number] DOB: 04-24-1951  Admission/Discharge Information   Admit Date:  07-29-18  Date of Death: Date of Death: 08/02/2018  Time of Death: Time of Death: December 11, 1311  Length of Stay: 4  Referring Physician: Barbette Reichmann, MD   Reason(s) for Hospitalization  Multivessel coronary artery disease Acute ST elevation myocardial infarction  Diagnoses  Preliminary cause of death: Multi-system organ failure, probable ischemic bowel  Secondary Diagnoses (including complications and co-morbidities):   History of PTCI Ischemic cardiomyopathy prior to CABG Dyslipidemia Acute on chronic renal failure PEA arrest Cardiogenic shock Multisystem organ failure   Brief Hospital Course (including significant findings, care, treatment, and services provided and events leading to death)  Michael Hess is a 68 y.o. year old male who presented to the hospital on 07-29-18 with a 2-week history of intermittent chest pain and ruled in for acute ST-elevation myocardial infarction.  He went on to have a left heart catheterization which demonstrated severe multivessel coronary artery disease and ejection fraction of 35 to 40%.  CT surgery consultation was requested and the patient was evaluated by Dr. Dorris Fetch.  Coronary bypass grafting was offered to the patient and he elected to proceed with surgery.  He remained stable and was taken to the operative room on 07/01/2018 where four-vessel coronary bypass grafting was accomplished.  He separated from cardiopulmonary bypass on milrinone and vasopressor support.  He was transferred to the surgical ICU in stable condition.  By the morning of postop day 1, he had been extubated and his respiratory status was stable.  His cardiac index was stable as well but still requiring inotropic and vasopressor support.  Later that evening, he developed atrial fibrillation and was started on amiodarone.  He  developed abdominal distention and was also noted to have worsening renal insufficiency with decreased urine output and increased creatinine.  Later in the evening on postop day 1 he had a PEA arrest.  He was resuscitated after initiation of CPR and increased inotropic and vasopressor support. He was re-intubated.  Bedside echocardiogram done at that time demonstrated ejection fraction of around 40% which was unchanged from the immediate postoperative echocardiography.  There is no evidence of pericardial effusion.  Consults were requested from critical care medicine team as well as nephrology.  Over the next 24 hours, he developed worsening metabolic acidosis that could not be corrected.  His poor prognosis due to multisystem organ failure was discussed with the family on multiple occasions.  Dialysis catheter was placed in CRRT was initiated but was unsuccessful in removing significant amount of volume 2 days persistent dependence on vasopressor support.  Despite optimization of support, he deteriorated hemodynamically and expired on Aug 02, 2018 at 13:13 with his wife and daughter at the bedside.    Pertinent Labs and Studies  Significant Diagnostic Studies Dg Abd 1 View  Result Date: August 02, 2018 CLINICAL DATA:  Respiratory failure requiring intubation. Yesterday they were tracking an ileus please look at accompanying chest xray as well Pt was elevated in semi-erect position. These are the best images I can do given his condition and everything attached EXAM: ABDOMEN - 1 VIEW COMPARISON:  07/02/2018 and chest x-ray today FINDINGS: Central gas-filled bowel loops are present. This raises the question of ascites. There is less dilatation of small bowel loops in the LEFT mid abdomen compared to prior study. No evidence for free intraperitoneal air on this semi-erect view of the abdomen. Study quality is degraded by patient's condition.Diffuse body wall  edema. IMPRESSION: 1. Improvement in small bowel  dilatation. 2. Suspect ascites. Electronically Signed   By: Norva Pavlov M.D.   On: 06/11/2018 09:50   Dg Abd 1 View  Result Date: 07/02/2018 CLINICAL DATA:  Ileus. Distal supine abdominal image acquired first, then pt started having difficulty so the nurse raised him to a semi-erect position. Proximal abdominal film is semi-erect. EXAM: ABDOMEN - 1 VIEW COMPARISON:  Chest x-ray on 07/02/2018 FINDINGS: Central air filled bowel loops raise the question of ascites. Paucity of colonic gas. There is dilatation of small bowel in the LEFT central abdomen raising the question of small bowel obstruction. A nasogastric tube is in place, tip overlying the region of the stomach. Diffuse body wall edema noted. IMPRESSION: 1. Dilated small bowel loops raising the question of small bowel obstruction. 2. Question of ascites. 3. Consider further evaluation CT of the abdomen and pelvis. Electronically Signed   By: Norva Pavlov M.D.   On: 07/02/2018 11:15   Dg Chest Port 1 View  Result Date: 06/08/2018 CLINICAL DATA:  Respiratory failure EXAM: PORTABLE CHEST 1 VIEW COMPARISON:  Chest radiograph 07/02/2018 FINDINGS: ET tube terminates in the mid trachea. Right IJ sheath projects over the superior vena cava. Right subclavian central venous catheter tip projects over the superior vena cava. Enteric tube courses inferior to the diaphragm. Monitoring leads overlie the patient. Interval removal left chest tube. Stable cardiomegaly. Persistent moderate left pleural effusion with similar-appearing consolidation throughout the left lung. Persistent right mid lower lung consolidative opacities. No pneumothorax. IMPRESSION: Support apparatus as above. Moderate left pleural effusion with persistent opacities within the left and right lungs. Electronically Signed   By: Annia Belt M.D.   On: 06/25/2018 09:40   Dg Chest Port 1 View  Result Date: 07/02/2018 CLINICAL DATA:  Postop day 3 CABG. Cardiac arrest (PEA) with acidosis  and progressive renal failure and hypotension. Ventilator dependent respiratory failure requiring 100% oxygen. EXAM: PORTABLE CHEST 1 VIEW 1:13 p.m.: COMPARISON:  Portable chest x-ray earlier same day 6:01 a.m. and previously. FINDINGS: Endotracheal tube tip approximately 2-3 cm above the carina. RIGHT jugular central venous catheter tip at or near the cavoatrial junction, unchanged. RIGHT subclavian central venous catheter tip at or near the cavoatrial junction, unchanged. Nasogastric tube looped in the stomach with its tip in the antrum, unchanged. LEFT chest tube in place with no pneumothorax. External pacing pads. Sternotomy for CABG. Cardiac silhouette moderately to markedly enlarged. Mild diffuse interstitial pulmonary edema, unchanged since earlier in the day, allowing for differences in radiographic technique. Stable dense atelectasis involving the LEFT LOWER LOBE. No new abnormalities. IMPRESSION: 1. Support apparatus satisfactory and unchanged. 2. Stable mild diffuse interstitial pulmonary edema. 3. Stable dense atelectasis involving the LEFT LOWER LOBE. 4. No new abnormalities. Electronically Signed   By: Hulan Saas M.D.   On: 07/02/2018 13:36   Dg Chest Port 1 View  Result Date: 07/02/2018 CLINICAL DATA:  Status post CABG EXAM: PORTABLE CHEST 1 VIEW COMPARISON:  Yesterday FINDINGS: Endotracheal tube tip with tip 3 cm above the carina. The orogastric tube reaches the stomach. Right subclavian and IJ lines with tips at the SVC and upper cavoatrial junction. Stable cardiomegaly.  Status post CABG. Continued retrocardiac opacification. Posterior left first rib fracture. IMPRESSION: Stable hardware positioning and atelectasis. Posterior left first rib fracture. Electronically Signed   By: Marnee Spring M.D.   On: 07/02/2018 08:23   Dg Chest Port 1 View  Result Date: 07/01/2018 CLINICAL DATA:  Central venous  line placement. EXAM: PORTABLE CHEST 1 VIEW COMPARISON:  July 01, 2018 7:39 p.m.  FINDINGS: The heart size and mediastinal contours are stable. The heart size is enlarged. Right central venous line is identified with distal tip in the superior vena cava/right atrial junction. Right jugular vascular sheath is identified distal tip in the superior vena cava. Endotracheal tube is identified distal tip 3.2 cm from carina. Nasogastric tube is identified with distal tip not included on film but probably at least in the stomach. Patchy consolidation of left lung base, right upper lobe and right lung base are identified. The visualized skeletal structures are unremarkable. IMPRESSION: Right subclavian central venous line distal tip in the superior vena cava/right atrial junction. There is no pneumothorax. Worsened patchy consolidation of the right lung. Persistent consolidation of left lung base. Electronically Signed   By: Sherian ReinWei-Chen  Lin M.D.   On: 07/01/2018 23:42   Dg Chest Port 1 View  Result Date: 07/01/2018 CLINICAL DATA:  CPR, respiratory failure. EXAM: PORTABLE CHEST 1 VIEW COMPARISON:  July 01, 2018 6:16 a.m. FINDINGS: The heart size and mediastinal contours are stable. Endotracheal tube is identified distal tip 2.3 cm from carina. Retraction by 1.5 cm is recommended. A right jugular vascular sheath is noted with distal tip in the superior vena cava. There is mild central pulmonary vascular congestion. Patchy consolidation of the medial left lung base is unchanged. There is no pneumothorax. The visualized skeletal structures are stable. IMPRESSION: Endotracheal tube is identified with distal tip 2.3 cm from carina. Retraction by 1.5 cm is recommended. Right jugular vascular sheath is noted with distal tip in the superior vena cava. Mild central pulmonary vascular congestion. These results will be called to the ordering clinician or representative by the Radiologist Assistant, and communication documented in the PACS or zVision Dashboard. Electronically Signed   By: Sherian ReinWei-Chen  Lin M.D.    On: 07/01/2018 20:20   Dg Chest Port 1 View  Result Date: 07/01/2018 CLINICAL DATA:  Status post CABG, mi EXAM: PORTABLE CHEST 1 VIEW COMPARISON:  06/18/2018 FINDINGS: Interval extubation and removal of enteric tube. Low lung volumes. Left basilar opacity, likely atelectasis. No pleural effusions. Left chest tube and mediastinal drain.  No pneumothorax is seen. Mild cardiomegaly.  Postsurgical changes related to prior CABG. Right IJ Swan-Ganz catheter with its tip in the right main pulmonary artery. Median sternotomy. IMPRESSION: Interval extubation and removal of enteric tube. Left chest tube and mediastinal drain.  No pneumothorax is seen. Additional postsurgical changes related to prior CABG, as above. Electronically Signed   By: Charline BillsSriyesh  Krishnan M.D.   On: 07/01/2018 06:37   Dg Chest Port 1 View  Result Date: 07/05/2018 CLINICAL DATA:  Post CABG EXAM: PORTABLE CHEST 1 VIEW COMPARISON:  2018-05-10 FINDINGS: Endotracheal tube has been placed and is 2 cm above the carina. Swan-Ganz catheter in the main pulmonary artery. Left chest tube in place, no pneumothorax. NG tube is in the stomach. Changes of CABG. Cardiomegaly with vascular congestion and areas of perihilar and lower lobe atelectasis. IMPRESSION: Postoperative changes with support devices as above. No visible pneumothorax. Vascular congestion with perihilar and lower lobe atelectasis. Electronically Signed   By: Charlett NoseKevin  Dover M.D.   On: 06/06/2018 14:21   Dg Chest Portable 1 View  Result Date: 2018-05-10 CLINICAL DATA:  Chest pain ongoing for a week. Heaviness and nausea. EXAM: PORTABLE CHEST 1 VIEW COMPARISON:  09/29/2014 FINDINGS: Lung volumes remain slightly low. Retrocardiac streaky parenchymal opacities are noted suspicious for atelectasis. Pneumonia would  be difficult to entirely exclude. There is mild diffuse interstitial edema, more pronounced than on prior. Colonic interposition projects over the liver shadow. External defibrillator  paddles project over the cardiac silhouette and epigastric region. No pneumothorax. No acute osseous abnormality. IMPRESSION: Low lung volumes with left basilar atelectasis. Superimposed pneumonia would be difficult to entirely exclude at the left lung base. Mild diffuse interstitial edema. Electronically Signed   By: Tollie Eth M.D.   On: 06/18/2018 17:17    Microbiology Recent Results (from the past 240 hour(s))  MRSA PCR Screening     Status: None   Collection Time: 06/20/2018  7:42 PM  Result Value Ref Range Status   MRSA by PCR NEGATIVE NEGATIVE Final    Comment:        The GeneXpert MRSA Assay (FDA approved for NASAL specimens only), is one component of a comprehensive MRSA colonization surveillance program. It is not intended to diagnose MRSA infection nor to guide or monitor treatment for MRSA infections. Performed at [facility], [address], [place], [place] [address]   Culture, blood (Routine X 2) w Reflex to ID Panel     Status: None (Preliminary result)   Collection Time: 07/02/18 10:12 AM  Result Value Ref Range Status   Specimen Description BLOOD RIGHT HAND  Final   Special Requests   Final    BOTTLES DRAWN AEROBIC ONLY Blood Culture results may not be optimal due to an inadequate volume of blood received in culture bottles   Culture   Final    NO GROWTH 4 DAYS Performed at [facility], [address] [place], [place] [address]    Report Status PENDING  Incomplete  Culture, blood (Routine X 2) w Reflex to ID Panel     Status: None (Preliminary result)   Collection Time: 07/02/18 10:31 AM  Result Value Ref Range Status   Specimen Description BLOOD LEFT ANTECUBITAL  Final   Special Requests   Final    BOTTLES DRAWN AEROBIC ONLY Blood Culture adequate volume   Culture   Final    NO GROWTH 4 DAYS Performed at [facility], [address], [place], [place] [address]    Report Status PENDING  Incomplete  Respiratory Panel by PCR     Status:  None   Collection Time: 07/02/18 10:54 AM  Result Value Ref Range Status   Adenovirus NOT DETECTED NOT DETECTED Final   Coronavirus 229E NOT DETECTED NOT DETECTED Final   Coronavirus HKU1 NOT DETECTED NOT DETECTED Final   Coronavirus NL63 NOT DETECTED NOT DETECTED Final   Coronavirus OC43 NOT DETECTED NOT DETECTED Final   Metapneumovirus NOT DETECTED NOT DETECTED Final   Rhinovirus / Enterovirus NOT DETECTED NOT DETECTED Final   Influenza A NOT DETECTED NOT DETECTED Final   Influenza B NOT DETECTED NOT DETECTED Final   Parainfluenza Virus 1 NOT DETECTED NOT DETECTED Final   Parainfluenza Virus 2 NOT DETECTED NOT DETECTED Final   Parainfluenza Virus 3 NOT DETECTED NOT DETECTED Final   Parainfluenza Virus 4 NOT DETECTED NOT DETECTED Final   Respiratory Syncytial Virus NOT DETECTED NOT DETECTED Final   Bordetella pertussis NOT DETECTED NOT DETECTED Final   Chlamydophila pneumoniae NOT DETECTED NOT DETECTED Final   Mycoplasma pneumoniae NOT DETECTED NOT DETECTED Final    Comment: Performed at [facility], [address], [place], [place] [address]    Lab Basic Metabolic Panel: Recent Labs  Lab 07/01/18 0410  07/01/18 1705  07/01/18 2029  07/02/18 0420 07/02/18 1201 07/02/18 1744 07/02/18 2300 2018-07-27 0309 2018-07-27 0545 Jul 27, 2018 0647 07-27-18 0900  NA 145   < >  --    < > 149*   < > 148* 146* 143 140 139 135 138 136  K 3.2*   < >  --    < > 4.9   < > 3.8 4.0 4.3 4.8 5.3* 4.8 5.3* 5.1  CL 110  --   --    < > 103   < > 101 100 102 103 102  --  100 98  CO2 23  --   --   --  21*  --  16* 20* 21* 17* 17*  --  15*  --   GLUCOSE 121*   < >  --    < > 161*   < > 168* 183* 129* 71 57*  --  114* 110*  BUN 18  --   --    < > 20   < > 25* 27* 25* 20 18  --  14 14  CREATININE 1.67*  --  1.99*   < > 2.55*   < > 3.06* 3.36* 3.20* 2.68* 2.50*  --  2.18* 2.00*  CALCIUM 7.0*  --   --   --  6.5*  --  6.5* 6.3* 6.5* 6.5* 6.3*  --  7.2*  --   MG 2.9*  --  2.8*  --  3.1*  --  2.7*  --   --    --  2.5*  --   --   --   PHOS  --   --   --   --   --   --   --   --  2.7  --  4.3  --   --   --    < > = values in this interval not displayed.   Liver Function Tests: Recent Labs  Lab 07/02/18 1744 2018-07-27 0309  AST  --  4,680*  ALT  --  3,575*  ALKPHOS  --  64  BILITOT  --  2.0*  PROT  --  4.1*  ALBUMIN 2.9* 2.7*  2.7*   Recent Labs  Lab 07/02/18 1055  LIPASE 20   No results for input(s): AMMONIA in the last 168 hours. CBC: Recent Labs  Lab 07/02/18 0114 07/02/18 0420 07/02/18 2300 2018-07-27 0309 07/27/18 0545 07-27-2018 0854 27-Jul-2018 0900  WBC 13.3* 15.3* 13.4* 10.0  --  8.5  --   NEUTROABS  --   --  12.2* 8.9*  --  7.5  --   HGB 10.1* 10.5* 9.6* 9.1* 8.2* 7.9* 7.8*  HCT 31.6* 32.7* 30.7* 30.2* 24.0* 25.5* 23.0*  MCV 78.4* 79.4* 79.7* 80.7  --  83.1  --   PLT 104* 111* 80* 73*  --  53*  --    Cardiac Enzymes: No results for input(s): CKTOTAL, CKMB, CKMBINDEX, TROPONINI in the last 168 hours. Sepsis Labs: Recent Labs  Lab 07/02/18 0420 07/02/18 1052 07/02/18 1055 07/02/18 1813 07/02/18 2300 07/27/18 0309 07-27-2018 0854  PROCALCITON  --   --  7.27  --   --  4.68  --   WBC 15.3*  --   --   --  13.4* 10.0 8.5  LATICACIDVEN  --  15.2*  --  11.1*  --  12.0*  --     Procedures/Operations  07/05/2018: Left heart catheterization 06/05/2018: CABG x4 with   LIMA to LAD   SVG  to D1   SVG to OM1   SVG to acute marginal  1227/2019: Placement of left femoral arterial line and right subclavian triple-lumen catheter 07/01/2018: Insertion of IJ trial since catheter and subsequent CRRT   Leary Roca 07/07/2018, 8:35 AM

## 2019-07-01 IMAGING — DX DG CHEST 1V PORT
1 series · 1 of 1 positions shown · non-contrast
Comparison: Portable chest x-ray earlier same day [DATE] a.m. and
previously.

CLINICAL DATA: Postop day 3 CABG. Cardiac arrest (HANDS) with
acidosis and progressive renal failure and hypotension. Ventilator
dependent respiratory failure requiring 100% oxygen.

EXAM:
PORTABLE CHEST 1 VIEW [DATE] p.m.:

[chest]
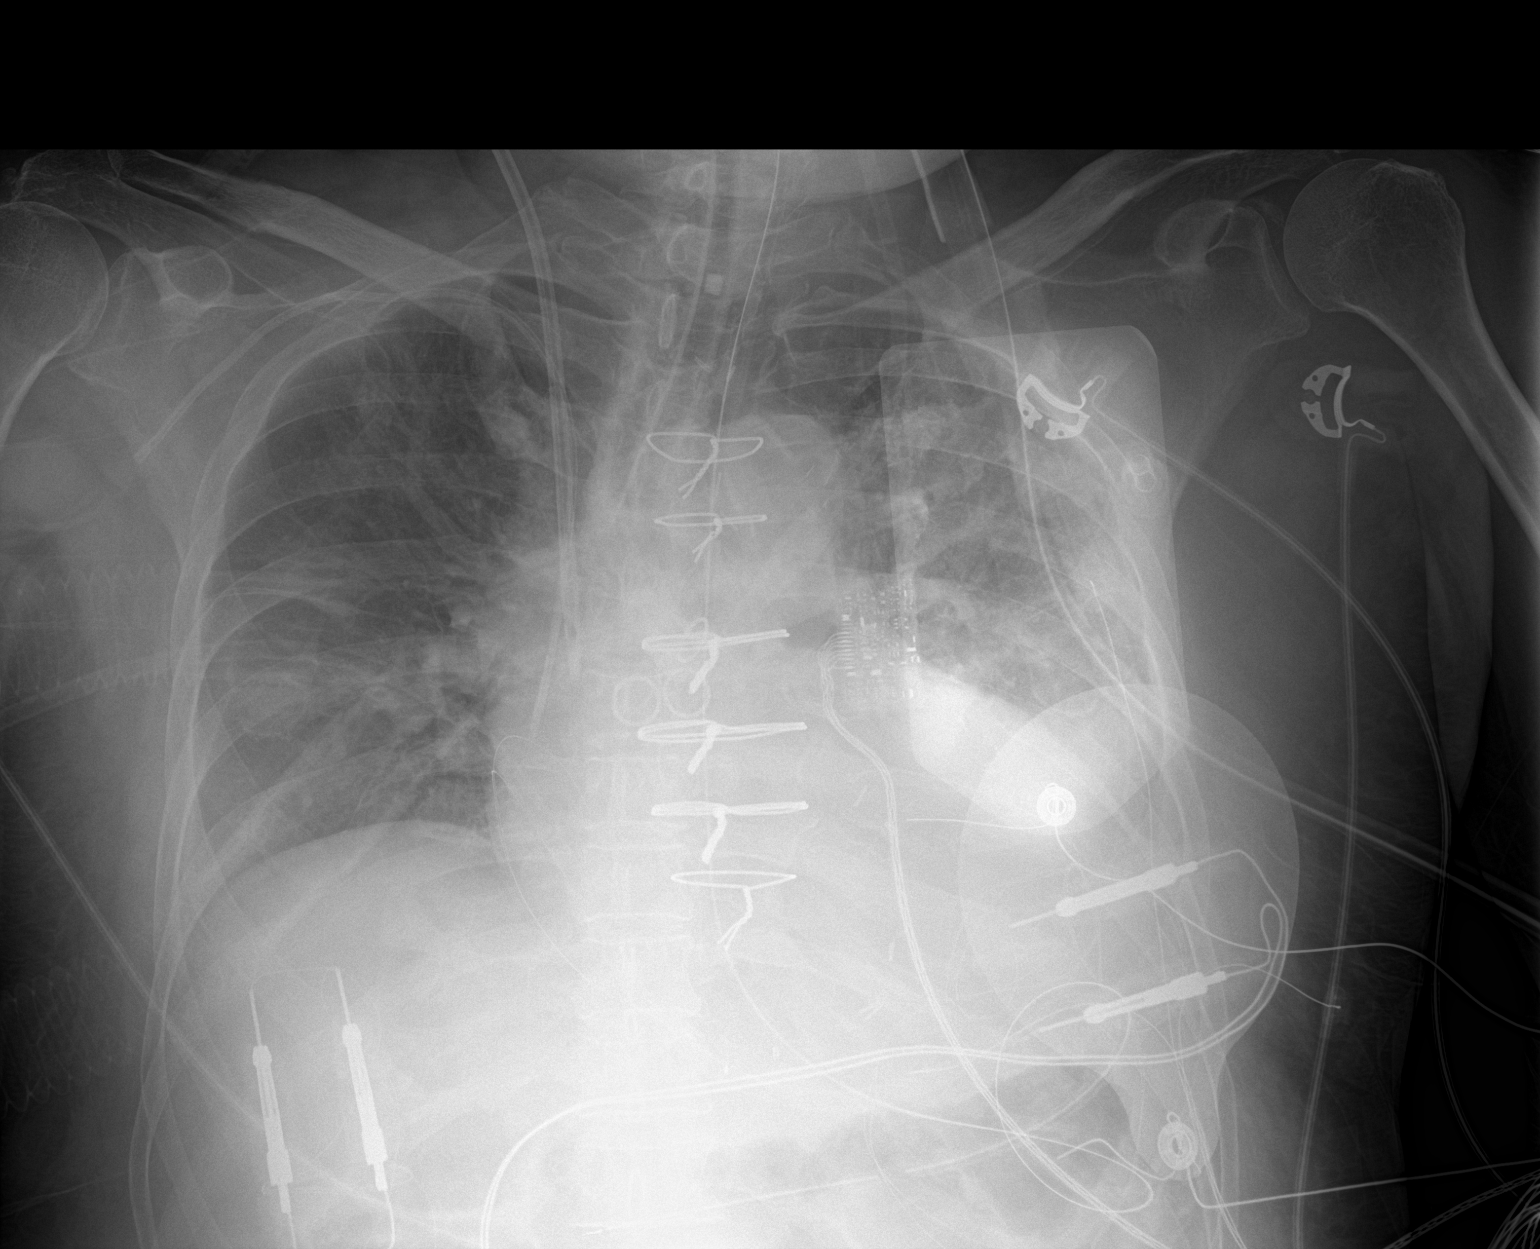

[1 of 1 positions shown; findings below may reference images not displayed]

FINDINGS: Endotracheal tube tip approximately 2-3 cm above the carina. RIGHT
jugular central venous catheter tip at or near the cavoatrial
junction, unchanged. RIGHT subclavian central venous catheter tip at
or near the cavoatrial junction, unchanged. Nasogastric tube looped
in the stomach with its tip in the antrum, unchanged. LEFT chest
tube in place with no pneumothorax. External pacing pads.

Sternotomy for CABG. Cardiac silhouette moderately to markedly
enlarged. Mild diffuse interstitial pulmonary edema, unchanged since
earlier in the day, allowing for differences in radiographic
technique. Stable dense atelectasis involving the LEFT LOWER LOBE.
No new abnormalities.
IMPRESSION: 1. Support apparatus satisfactory and unchanged.
2. Stable mild diffuse interstitial pulmonary edema.
3. Stable dense atelectasis involving the LEFT LOWER LOBE.
4. No new abnormalities.

## 2019-07-02 IMAGING — DX DG CHEST 1V PORT
1 series · 1 of 1 positions shown · non-contrast
Comparison: Chest radiograph 07/02/2018

CLINICAL DATA: Respiratory failure

EXAM:
PORTABLE CHEST 1 VIEW

[chest]
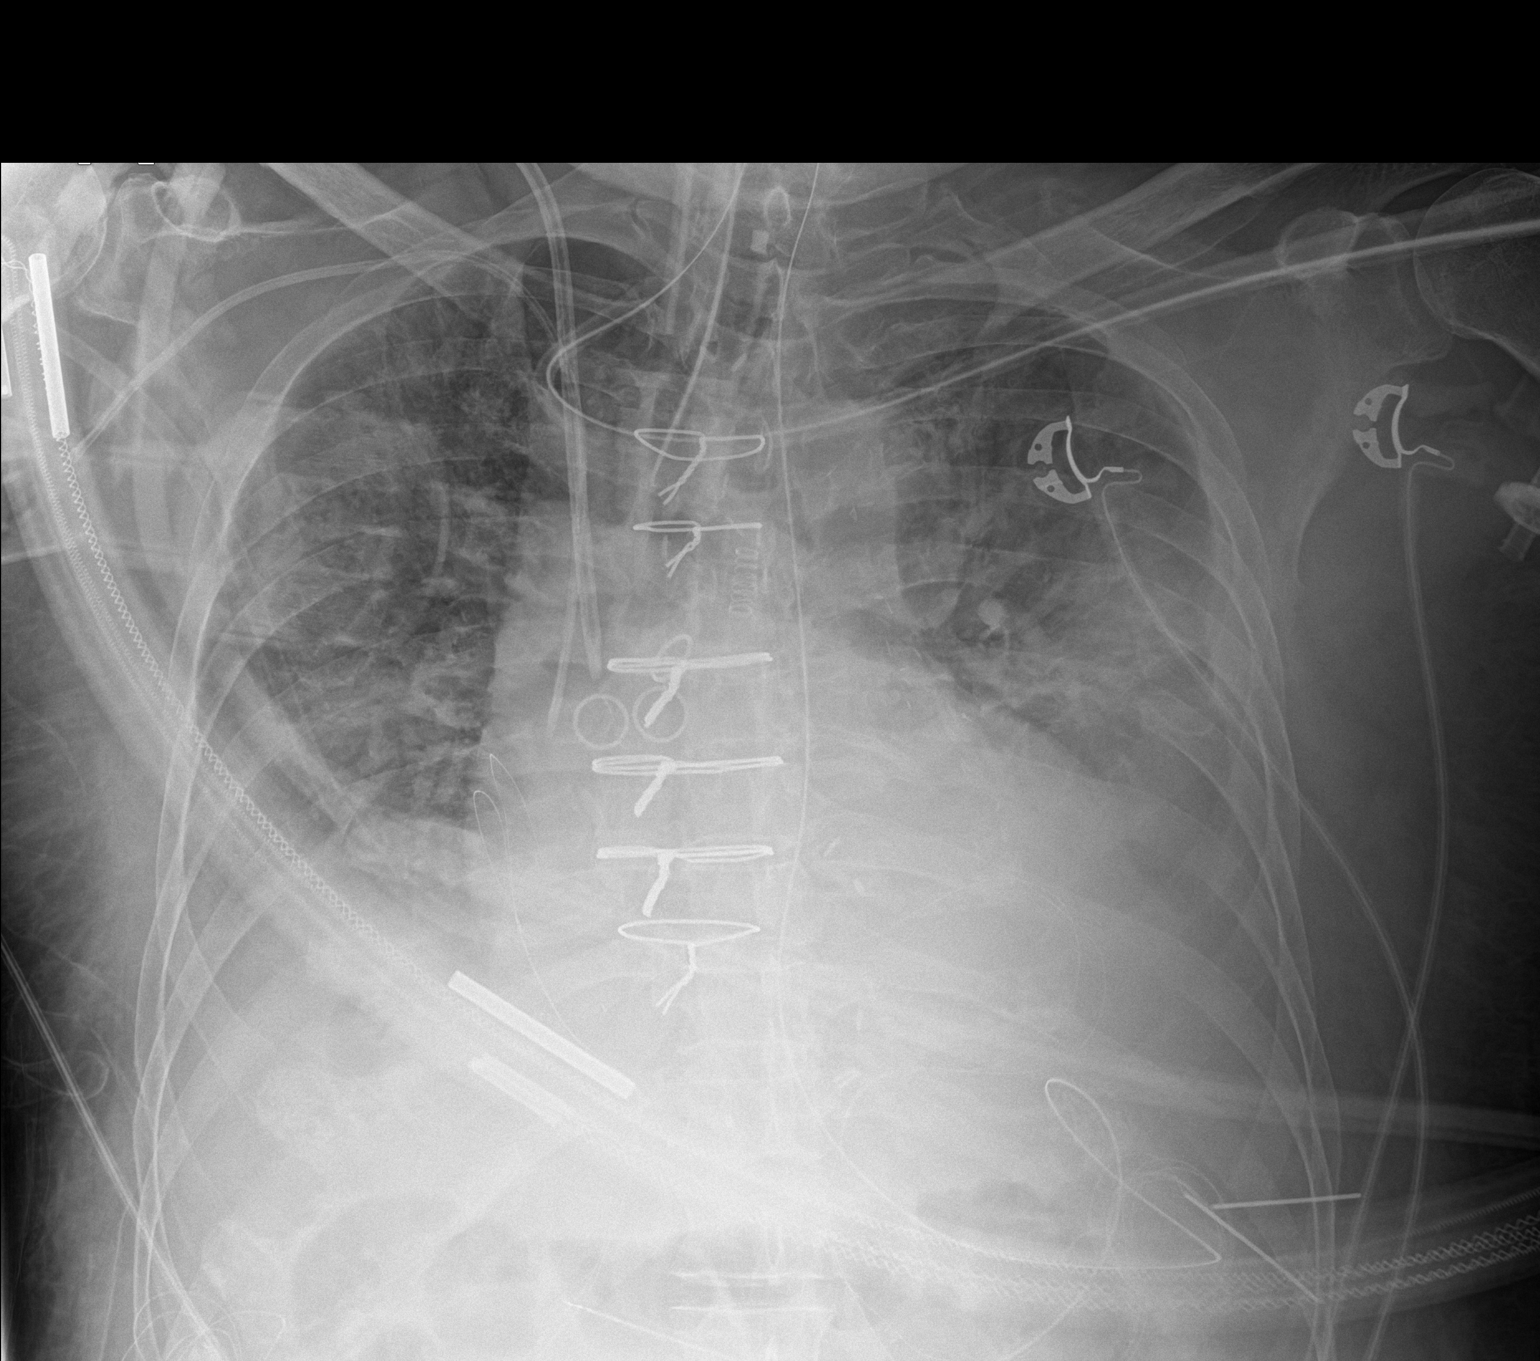

[1 of 1 positions shown; findings below may reference images not displayed]

FINDINGS: ET tube terminates in the mid trachea. Right IJ sheath projects over
the superior vena cava. Right subclavian central venous catheter tip
projects over the superior vena cava. Enteric tube courses inferior
to the diaphragm. Monitoring leads overlie the patient. Interval
removal left chest tube.

Stable cardiomegaly. Persistent moderate left pleural effusion with
similar-appearing consolidation throughout the left lung. Persistent
right mid lower lung consolidative opacities. No pneumothorax.
IMPRESSION: Support apparatus as above.

Moderate left pleural effusion with persistent opacities within the
left and right lungs.
